# Patient Record
Sex: Male | Born: 1965
Health system: Southern US, Community
[De-identification: ages and names within clinical notes are randomized; demographics above are authoritative.]

## PROBLEM LIST (undated history)

## (undated) DIAGNOSIS — I10 Essential (primary) hypertension: Secondary | ICD-10-CM

## (undated) DIAGNOSIS — M1990 Unspecified osteoarthritis, unspecified site: Secondary | ICD-10-CM

## (undated) DIAGNOSIS — M459 Ankylosing spondylitis of unspecified sites in spine: Secondary | ICD-10-CM

## (undated) HISTORY — PX: ANTERIOR CRUCIATE LIGAMENT REPAIR: SHX115

## (undated) HISTORY — PX: ROTATOR CUFF REPAIR: SHX139

---

## 1996-01-30 DIAGNOSIS — M459 Ankylosing spondylitis of unspecified sites in spine: Secondary | ICD-10-CM | POA: Insufficient documentation

## 1998-06-01 ENCOUNTER — Ambulatory Visit (HOSPITAL_COMMUNITY): Admission: RE | Admit: 1998-06-01 | Discharge: 1998-06-01 | Payer: Self-pay | Admitting: Family Medicine

## 1998-06-01 ENCOUNTER — Encounter: Payer: Self-pay | Admitting: Family Medicine

## 1998-12-20 ENCOUNTER — Ambulatory Visit (HOSPITAL_COMMUNITY): Admission: RE | Admit: 1998-12-20 | Discharge: 1998-12-20 | Payer: Self-pay | Admitting: Family Medicine

## 1998-12-20 ENCOUNTER — Encounter: Payer: Self-pay | Admitting: Family Medicine

## 1999-11-13 ENCOUNTER — Ambulatory Visit (HOSPITAL_COMMUNITY): Admission: RE | Admit: 1999-11-13 | Discharge: 1999-11-13 | Payer: Self-pay | Admitting: Family Medicine

## 1999-11-13 ENCOUNTER — Encounter: Payer: Self-pay | Admitting: Family Medicine

## 1999-11-30 ENCOUNTER — Encounter: Admission: RE | Admit: 1999-11-30 | Discharge: 2000-01-04 | Payer: Self-pay | Admitting: Family Medicine

## 2000-02-27 ENCOUNTER — Encounter: Admission: RE | Admit: 2000-02-27 | Discharge: 2000-02-27 | Payer: Self-pay | Admitting: Family Medicine

## 2000-02-27 ENCOUNTER — Encounter: Payer: Self-pay | Admitting: Family Medicine

## 2000-07-01 ENCOUNTER — Encounter: Admission: RE | Admit: 2000-07-01 | Discharge: 2000-07-01 | Payer: Self-pay | Admitting: Family Medicine

## 2000-07-01 ENCOUNTER — Encounter: Payer: Self-pay | Admitting: Family Medicine

## 2000-07-04 ENCOUNTER — Encounter: Payer: Self-pay | Admitting: Orthopedic Surgery

## 2000-07-04 ENCOUNTER — Encounter: Admission: RE | Admit: 2000-07-04 | Discharge: 2000-07-04 | Payer: Self-pay | Admitting: Orthopedic Surgery

## 2000-07-23 ENCOUNTER — Encounter: Payer: Self-pay | Admitting: Orthopedic Surgery

## 2000-07-23 ENCOUNTER — Ambulatory Visit (HOSPITAL_COMMUNITY): Admission: RE | Admit: 2000-07-23 | Discharge: 2000-07-23 | Payer: Self-pay | Admitting: Orthopedic Surgery

## 2000-08-15 ENCOUNTER — Encounter: Admission: RE | Admit: 2000-08-15 | Discharge: 2000-08-30 | Payer: Self-pay | Admitting: Orthopedic Surgery

## 2002-12-15 ENCOUNTER — Emergency Department (HOSPITAL_COMMUNITY): Admission: EM | Admit: 2002-12-15 | Discharge: 2002-12-15 | Payer: Self-pay | Admitting: Emergency Medicine

## 2003-03-22 ENCOUNTER — Ambulatory Visit (HOSPITAL_COMMUNITY): Admission: RE | Admit: 2003-03-22 | Discharge: 2003-03-22 | Payer: Self-pay | Admitting: Internal Medicine

## 2003-04-11 ENCOUNTER — Emergency Department (HOSPITAL_COMMUNITY): Admission: EM | Admit: 2003-04-11 | Discharge: 2003-04-11 | Payer: Self-pay | Admitting: Emergency Medicine

## 2003-10-15 ENCOUNTER — Ambulatory Visit: Payer: Self-pay | Admitting: *Deleted

## 2004-02-18 ENCOUNTER — Emergency Department (HOSPITAL_COMMUNITY): Admission: EM | Admit: 2004-02-18 | Discharge: 2004-02-18 | Payer: Self-pay | Admitting: Family Medicine

## 2005-03-02 ENCOUNTER — Encounter: Admission: RE | Admit: 2005-03-02 | Discharge: 2005-05-31 | Payer: Self-pay | Admitting: Occupational Medicine

## 2006-02-14 ENCOUNTER — Emergency Department (HOSPITAL_COMMUNITY): Admission: EM | Admit: 2006-02-14 | Discharge: 2006-02-14 | Payer: Self-pay | Admitting: Emergency Medicine

## 2011-08-24 ENCOUNTER — Encounter (HOSPITAL_COMMUNITY): Payer: Self-pay | Admitting: *Deleted

## 2011-08-24 ENCOUNTER — Emergency Department (HOSPITAL_COMMUNITY)
Admission: EM | Admit: 2011-08-24 | Discharge: 2011-08-24 | Disposition: A | Discharge status: Home / Self Care | Payer: PRIVATE HEALTH INSURANCE | Attending: Emergency Medicine | Admitting: Emergency Medicine

## 2011-08-24 DIAGNOSIS — H209 Unspecified iridocyclitis: Secondary | ICD-10-CM | POA: Insufficient documentation

## 2011-08-24 DIAGNOSIS — M129 Arthropathy, unspecified: Secondary | ICD-10-CM | POA: Insufficient documentation

## 2011-08-24 HISTORY — DX: Unspecified osteoarthritis, unspecified site: M19.90

## 2011-08-24 HISTORY — DX: Ankylosing spondylitis of unspecified sites in spine: M45.9

## 2011-08-24 MED ORDER — ATROPINE SULFATE 1 % OP SOLN
1.0000 [drp] | Freq: Once | OPHTHALMIC | Status: AC
  Administered 2011-08-24: 1 [drp] via OPHTHALMIC
  Filled 2011-08-24: qty 2

## 2011-08-24 MED ORDER — ATROPINE SULFATE 1 % OP SOLN: 1.0000 [drp] | Freq: Four times a day (QID) | OPHTHALMIC | Status: DC

## 2011-08-24 MED ORDER — PREDNISOLONE ACETATE 1 % OP SUSP: 2.0000 [drp] | Freq: Four times a day (QID) | OPHTHALMIC | Status: AC

## 2011-08-24 MED ORDER — PREDNISOLONE ACETATE 1 % OP SUSP
2.0000 [drp] | Freq: Once | OPHTHALMIC | Status: AC
  Administered 2011-08-24: 2 [drp] via OPHTHALMIC
  Filled 2011-08-24: qty 1

## 2011-08-24 NOTE — ED Provider Notes (Signed)
Medical screening examination/treatment/procedure(s) were performed by non-physician practitioner and as supervising physician I was immediately available for consultation/collaboration.  Erinne Gillentine R. Gerrett Loman, MD 08/24/11 0724 

## 2011-08-24 NOTE — ED Notes (Addendum)
[facility].  He was tx with tobramycin but the pain, photophobia and redness is increasing.  He has a hx of iritis d/t his hx of ankylosing spondylitis

## 2011-08-24 NOTE — ED Provider Notes (Signed)
History     CSN: [id number]  Arrival date & time 08/24/11  0159   First MD Initiated Contact with Patient 08/24/11 0224      Chief Complaint  Patient presents with  . Eye Pain    (Consider location/radiation/quality/duration/timing/severity/associated sxs/prior treatment) HPI Comments: Pateint with Hx Ankylosing Spondylitis and Iritis started havign L eye pain and redness 4 days ago [facility] and given antibiotic drops without relief + photophobia, redness without drainage/discharge  Patient is a 46 y.o. male presenting with eye pain. The history is provided by the patient.  Eye Pain This is a recurrent problem. The current episode started in the past 7 days. The problem has been gradually worsening. Pertinent negatives include no fever, headaches, neck pain or rash. The treatment provided moderate relief.    Past Medical History  Diagnosis Date  . Ankylosing spondylitis   . Arthritis     Past Surgical History  Procedure Date  . Anterior cruciate ligament repair   . Rotator cuff repair     No family history on file.  History  Substance Use Topics  . Smoking status: Never Smoker   . Smokeless tobacco: Not on file  . Alcohol Use: Yes     rarely      Review of Systems  Constitutional: Negative for fever.  HENT: Negative for neck pain.   Eyes: Positive for photophobia, pain and redness. Negative for discharge and itching.  Skin: Negative for rash.  Neurological: Negative for dizziness and headaches.    Allergies  Review of patient's allergies indicates no known allergies.  Home Medications   Current Outpatient Rx  Name Route Sig Dispense Refill  . NAPROXEN SODIUM 220 MG PO TABS Oral Take 220 mg by mouth daily as needed. For pain    . ATROPINE SULFATE 1 % OP SOLN Left Eye Place 1 drop into the left eye 4 (four) times daily. 2 mL 0  . PREDNISOLONE ACETATE 1 % OP SUSP Left Eye Place 2 drops into the left eye 4 (four) times daily. 5 mL 0    BP  136/102  Pulse 90  Temp 98.3 F (36.8 C) (Oral)  Resp 16  SpO2 99%  Physical Exam  Constitutional: He appears well-developed and well-nourished.  HENT:  Head: Normocephalic.  Eyes: Left eye exhibits no discharge and no exudate. No foreign body present in the left eye. Left conjunctiva is injected. Left conjunctiva has no hemorrhage. Left eye exhibits normal extraocular motion and no nystagmus. Left pupil is round and reactive.  Neck: Normal range of motion.  Cardiovascular: Normal rate.   Pulmonary/Chest: Effort normal.  Abdominal: Soft.  Genitourinary: Penis normal.  Neurological: He is alert.  Skin: Skin is warm. No rash noted.    ED Course  Procedures (including critical care time)  Labs Reviewed - No data to display No results found.   1. Iritis       MDM  Will treat with atropine optho gtts and Prednisone optho gtts And have patient FU with opthamology Patient received immediate relief.  Once the midriatic drop was placed       Arman Filter, NP 08/24/11 0258  Arman Filter, NP 08/24/11 0258  Arman Filter, NP 08/24/11 [phone]

## 2011-10-06 ENCOUNTER — Emergency Department (HOSPITAL_COMMUNITY)
Admission: EM | Admit: 2011-10-06 | Discharge: 2011-10-06 | Disposition: A | Discharge status: Home / Self Care | Payer: Self-pay | Source: Home / Self Care | Attending: Emergency Medicine | Admitting: Emergency Medicine

## 2011-10-06 ENCOUNTER — Encounter (HOSPITAL_COMMUNITY): Payer: Self-pay | Admitting: Emergency Medicine

## 2011-10-06 DIAGNOSIS — M459 Ankylosing spondylitis of unspecified sites in spine: Secondary | ICD-10-CM

## 2011-10-06 MED ORDER — HYDROCODONE-ACETAMINOPHEN 5-325 MG PO TABS: ORAL_TABLET | ORAL | Status: AC

## 2011-10-06 MED ORDER — INDOMETHACIN ER 75 MG PO CPCR: 75.0000 mg | ORAL_CAPSULE | Freq: Two times a day (BID) | ORAL | Status: DC

## 2011-10-06 NOTE — ED Notes (Signed)
Neck and back pain for 3-4 months ago, denies injury.

## 2011-10-06 NOTE — ED Provider Notes (Signed)
Chief Complaint  Patient presents with  . 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. 30 years ago he was diagnosed with ankylosing spondylitis. This was first diagnosed by Dr. Myrtie Neither and he was referred to Dr. Durenda Age. He's been treated with indomethacin, Vioxx, Vicodin, and Ultram. Recently he's been using some topicals and naproxen without a lot of improvement. He's had polyarthralgias and stiffness and pain in his neck and back. He has had pain in his right shoulder which has been diagnosed with rotator cuff tear and also swelling of his knees related to the anterior cruciate ligament and meniscus injuries. He notes decreased range of motion of his back and neck. His symptoms have been worse the past 3-4 months. He doesn't have anything to take right now except for over-the-counter medications. He plans to see a rheumatologist sometime in the near future.  Review of Systems:  Other than noted above, the patient denies any of the following symptoms: Systemic:  No fevers, chills, sweats, or aches.  No fatigue or tiredness. Musculoskeletal:  No joint pain, arthritis, bursitis, swelling, back pain, or neck pain. Neurological:  No muscular weakness, paresthesias, headache, or trouble with speech or coordination.  No dizziness.   PMFSH:  Past medical history, family history, social history, meds, and allergies were reviewed.  Physical Exam:   Vital signs:  BP 139/95  Pulse 64  Temp 97.9 F (36.6 C) (Oral)  Resp 14  SpO2 100% Gen:  Alert and oriented times 3.  In no distress. Musculoskeletal: His back and his neck have a surprisingly good range of motion in his Shober's index is normal. Otherwise, all joints had a full a ROM with no swelling, bruising or deformity.  No edema, pulses full. Extremities were warm and pink.  Capillary refill was brisk.  Skin:  Clear, warm and dry.  No rash. Neuro:  Alert and oriented times 3.   Muscle strength was normal.  Sensation was intact to light touch.   Assessment:  The encounter diagnosis was Ankylosing spondylitis.  Plan:   1.  The following meds were prescribed:   New Prescriptions   HYDROCODONE-ACETAMINOPHEN (NORCO/VICODIN) 5-325 MG PER TABLET    1 to 2 tabs every 4 to 6 hours as needed for pain.   INDOMETHACIN (INDOCIN SR) 75 MG CR CAPSULE    Take 1 capsule (75 mg total) by mouth 2 (two) times daily with a meal.   2.  The patient was instructed in symptomatic care, including rest and activity, elevation, application of ice and compression.  Appropriate handouts were given. 3.  The patient was told to return if becoming worse in any way, if no better in 3 or 4 days, and given some red flag symptoms that would indicate earlier return.   4.  The patient was told to follow up with Dr. Stacey Drain or Dr. Durenda Age.   Reuben Likes, MD 10/06/11 2142

## 2011-11-12 ENCOUNTER — Ambulatory Visit
Admission: RE | Admit: 2011-11-12 | Discharge: 2011-11-12 | Disposition: A | Discharge status: Home / Self Care | Payer: 59 | Source: Ambulatory Visit | Attending: Rheumatology | Admitting: Rheumatology

## 2011-11-12 ENCOUNTER — Other Ambulatory Visit: Payer: Self-pay | Admitting: Rheumatology

## 2011-11-12 DIAGNOSIS — M549 Dorsalgia, unspecified: Secondary | ICD-10-CM

## 2011-12-13 ENCOUNTER — Encounter: Payer: Self-pay | Admitting: Pharmacist

## 2011-12-13 ENCOUNTER — Ambulatory Visit (INDEPENDENT_AMBULATORY_CARE_PROVIDER_SITE_OTHER): Payer: Self-pay | Admitting: Pharmacist

## 2011-12-13 VITALS — BP 147/85 | HR 79 | Ht 74.5 in | Wt 180.2 lb

## 2011-12-13 DIAGNOSIS — M459 Ankylosing spondylitis of unspecified sites in spine: Secondary | ICD-10-CM

## 2011-12-13 DIAGNOSIS — H209 Unspecified iridocyclitis: Secondary | ICD-10-CM

## 2011-12-13 NOTE — Patient Instructions (Addendum)
Thanks for coming in today!

## 2011-12-13 NOTE — Assessment & Plan Note (Signed)
Following medication review, no suggestions for change.  Complete medication list provided to patient.  Total time in face to face medication review: 15 minutes.  Patient seen with: Bola Lawuyi PharmD, Pharmacy Resident. 

## 2011-12-13 NOTE — Progress Notes (Signed)
  Subjective:    Patient ID: Cameron Brown, male    DOB: 09-29-1965, 46 y.o.   MRN: [id number]  HPI Patient arrives in good spirits for medication review.   Reports seeing looking for primary care provider, Dr. Kellie Simmering as rheumatologist. Reports being diagnosed with ankylosing spondylitis since age 76 and Has been recently been started on Humira   Review of Systems     Objective:   Physical Exam        Assessment & Plan:  Following medication review, no suggestions for change.  Complete medication list provided to patient.  Total time in face to face medication review: 15 minutes.  Patient seen with: Maryanna Shape PharmD, Pharmacy Resident.

## 2011-12-18 NOTE — Progress Notes (Signed)
Patient ID: Cameron Brown, male   DOB: 23-Aug-1965, 46 y.o.   MRN: [id number] Reviewed and agree with Dr. Macky Lower documentation and management.

## 2012-12-04 ENCOUNTER — Emergency Department (HOSPITAL_COMMUNITY)
Admission: EM | Admit: 2012-12-04 | Discharge: 2012-12-04 | Disposition: A | Discharge status: Home / Self Care | Payer: 59 | Source: Home / Self Care | Attending: Family Medicine | Admitting: Family Medicine

## 2012-12-04 ENCOUNTER — Encounter (HOSPITAL_COMMUNITY): Payer: Self-pay | Admitting: Emergency Medicine

## 2012-12-04 DIAGNOSIS — N529 Male erectile dysfunction, unspecified: Secondary | ICD-10-CM | POA: Diagnosis present

## 2012-12-04 MED ORDER — TADALAFIL 5 MG PO TABS: 5.0000 mg | ORAL_TABLET | Freq: Every day | ORAL | Status: AC

## 2012-12-04 NOTE — ED Notes (Signed)
Refill on medication of cialis

## 2012-12-04 NOTE — ED Provider Notes (Signed)
Cameron Brown is a 47 y.o. male who presents to Urgent Care today for erectile dysfunction. Patient has a history of erectile dysfunction over the past several years. He uses Cialis daily to treat this problem. It works very well. He has run out of his prescription and has not yet had a primary care doctor. He feels well without any other issues. No fevers chills nausea vomiting or diarrhea   Past Medical History  Diagnosis Date  . Ankylosing spondylitis   . Arthritis    History  Substance Use Topics  . Smoking status: Former Smoker -- 0.50 packs/day for 10 years    Types: Cigarettes    Start date: 01/30/1991    Quit date: 01/29/2001  . Smokeless tobacco: Never Used     Comment: Cigars prn boredom  . Alcohol Use: Yes     Comment: rarely   ROS as above Medications reviewed. No current facility-administered medications for this encounter.   Current Outpatient Prescriptions  Medication Sig Dispense Refill  . adalimumab (HUMIRA) 40 MG/0.8ML injection Inject 0.8 mLs (40 mg total) into the skin every 14 (fourteen) days.      Marland Kitchen atropine 1 % ophthalmic solution Place 1 drop into the left eye 4 (four) times daily.  2 mL  0  . Glucosamine-Chondroit-Vit C-Mn (GLUCOSAMINE CHONDR 1500 COMPLX PO) Take 1 tablet by mouth daily.      . naproxen sodium (ANAPROX) 220 MG tablet Take 220 mg by mouth daily as needed. For pain      . tadalafil (CIALIS) 5 MG tablet Take 1 tablet (5 mg total) by mouth daily.  30 tablet  1    Exam:  BP 135/83  Pulse 73  Temp(Src) 98.2 F (36.8 C) (Oral)  Resp 16  SpO2 100% Gen: Well NAD Lungs: CTABL Nl WOB Heart: RRR no MRG Abd: NABS, NT, ND  No results found for this or any previous visit (from the past 24 hour(s)). No results found.  Assessment and Plan: 47 y.o. male with erectile dysfunction.  Plan the refill Cialis and advised patient to followup with primary care provider.  Discussed warning signs or symptoms. Please see discharge instructions.  Patient expresses understanding.      Rodolph Bong, MD 12/04/12 1051

## 2014-01-30 IMAGING — CR DG LUMBAR SPINE COMPLETE 4+V
5 series · 5 of 5 positions shown · non-contrast
Comparison: None

CLINICAL DATA: Low back pain.

LUMBAR SPINE - COMPLETE 4+ VIEW

[t l-spine a.p.]
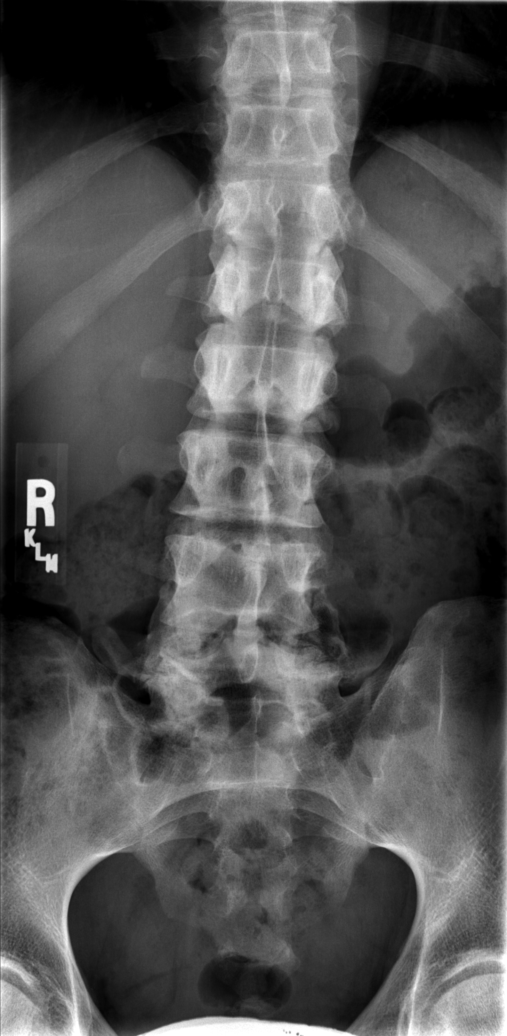

[t l-spine oblique exposure (1 of 2)]
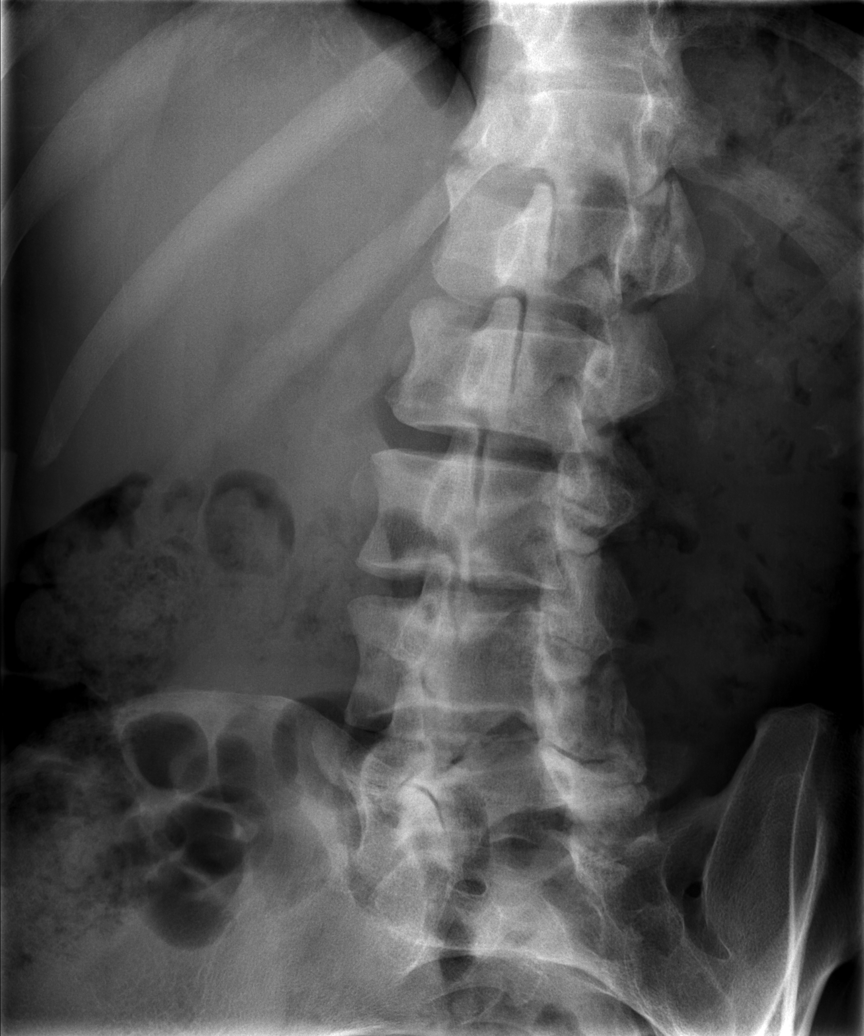

[t l-spine oblique exposure (2 of 2)]
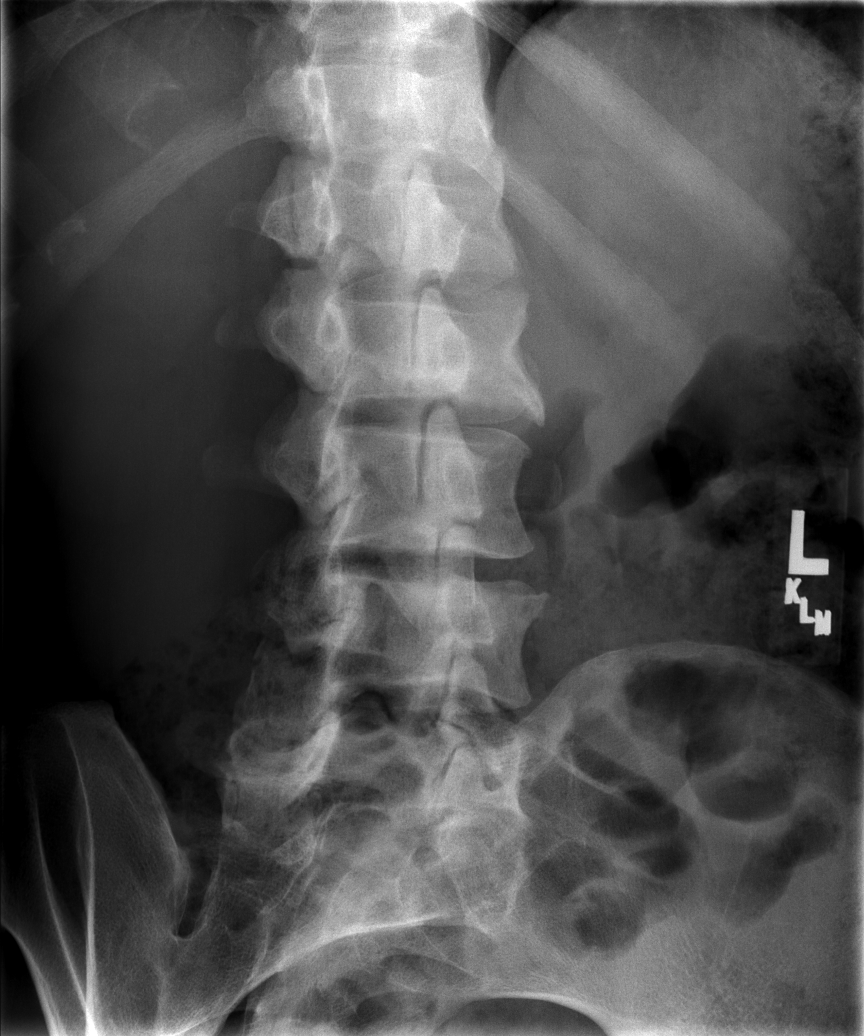

[t l-spine lat]
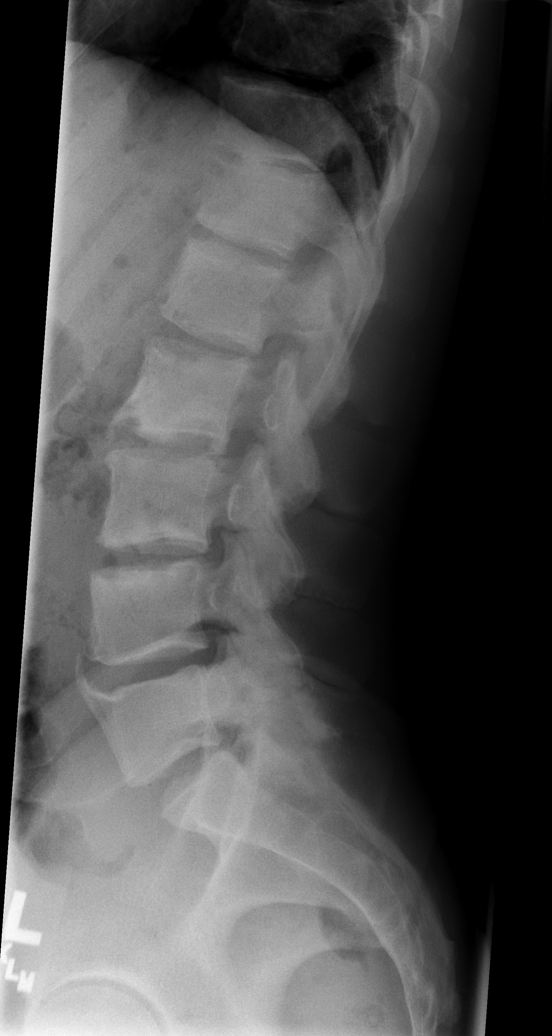

[t l-spine l5-s1 spot]
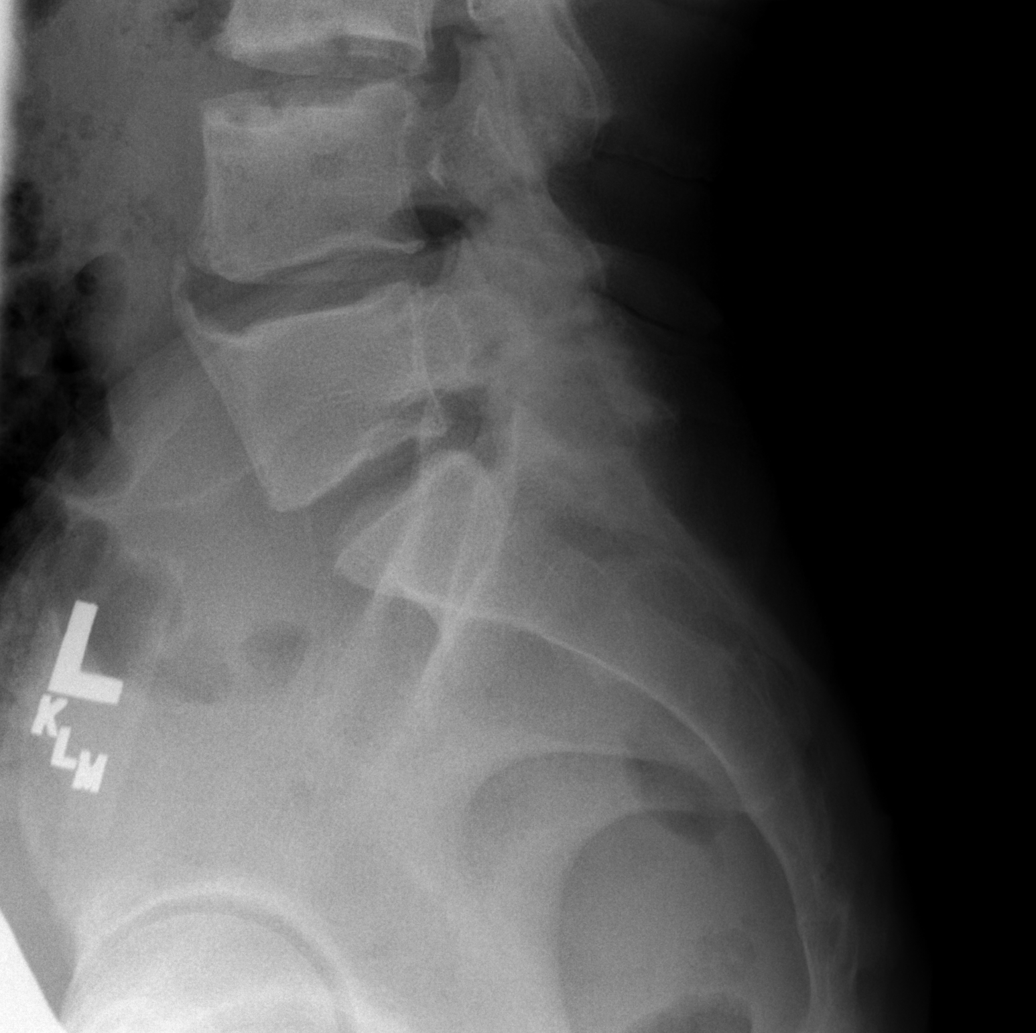

[5 of 5 positions shown; findings below may reference images not displayed]

FINDINGS: Degenerative disc and facet disease throughout the lumbar
spine.  Advanced degenerative facet disease at L4-5 and L5-S1 with
bony overgrowth.  Normal alignment.  No fracture.  There appears to
be fusion across the SI joints bilaterally.
IMPRESSION: Degenerative changes in the lower lumbar spine, particularly facet
arthropathy.  Apparent fusion across the SI joints bilaterally.

## 2015-02-03 DIAGNOSIS — Z79899 Other long term (current) drug therapy: Secondary | ICD-10-CM | POA: Diagnosis not present

## 2015-02-03 DIAGNOSIS — M459 Ankylosing spondylitis of unspecified sites in spine: Secondary | ICD-10-CM | POA: Diagnosis not present

## 2015-02-03 DIAGNOSIS — M25561 Pain in right knee: Secondary | ICD-10-CM | POA: Diagnosis not present

## 2015-02-03 MED FILL — HUMIRA PEN 40 MG/0.8ML PNKT: 40 | 28 days supply | Qty: 2 | Fill #0

## 2015-02-23 MED FILL — CIALIS 5 MG TABLET: 5 | 30 days supply | Qty: 30 | Fill #6

## 2015-02-24 MED FILL — HUMIRA PEN 40 MG/0.8ML PNKT: 40 | 28 days supply | Qty: 2 | Fill #1

## 2015-03-15 DIAGNOSIS — R030 Elevated blood-pressure reading, without diagnosis of hypertension: Secondary | ICD-10-CM | POA: Diagnosis not present

## 2015-03-15 DIAGNOSIS — M450 Ankylosing spondylitis of multiple sites in spine: Secondary | ICD-10-CM | POA: Diagnosis not present

## 2015-03-15 DIAGNOSIS — F5221 Male erectile disorder: Secondary | ICD-10-CM | POA: Diagnosis not present

## 2015-03-30 MED FILL — HUMIRA PEN 40 MG/0.8ML PNKT: 40 | 28 days supply | Qty: 2 | Fill #2

## 2015-03-31 MED FILL — CIALIS 5 MG TABLET: 5 | 30 days supply | Qty: 30 | Fill #0

## 2015-04-26 MED FILL — CIALIS 5 MG TABLET: 5 | 30 days supply | Qty: 30 | Fill #1

## 2015-04-26 MED FILL — HUMIRA PEN 40 MG/0.8ML PNKT: 40 | 28 days supply | Qty: 2 | Fill #3

## 2015-05-26 MED FILL — CIALIS 5 MG TABLET: 5 | 30 days supply | Qty: 30 | Fill #2

## 2015-05-26 MED FILL — HUMIRA PEN 40 MG/0.8ML PNKT: 40 | 28 days supply | Qty: 2 | Fill #4

## 2015-06-20 MED FILL — CIALIS 5 MG TABLET: 5 | 30 days supply | Qty: 30 | Fill #3

## 2015-06-20 MED FILL — HUMIRA PEN 40 MG/0.8ML PNKT: 40 | 28 days supply | Qty: 2 | Fill #5

## 2015-07-29 MED FILL — CIALIS 5 MG TABLET: 5 | 30 days supply | Qty: 30 | Fill #4

## 2015-08-04 DIAGNOSIS — H209 Unspecified iridocyclitis: Secondary | ICD-10-CM | POA: Diagnosis not present

## 2015-08-04 DIAGNOSIS — M459 Ankylosing spondylitis of unspecified sites in spine: Secondary | ICD-10-CM | POA: Diagnosis not present

## 2015-08-04 DIAGNOSIS — M545 Low back pain: Secondary | ICD-10-CM | POA: Diagnosis not present

## 2015-08-04 DIAGNOSIS — Z79899 Other long term (current) drug therapy: Secondary | ICD-10-CM | POA: Diagnosis not present

## 2015-08-04 MED FILL — HUMIRA PEN 40 MG/0.8ML PNKT: 40 | 28 days supply | Qty: 2 | Fill #0

## 2015-08-22 ENCOUNTER — Telehealth: Payer: Self-pay | Admitting: Pharmacist

## 2015-08-22 NOTE — Telephone Encounter (Signed)
Called patient to schedule an appointment for the [facility]. I was unable to reach the patient so I left a HIPAA-compliant message requesting that the patient return my call.   

## 2015-08-24 MED FILL — CIALIS 5 MG TABLET: 5 | 30 days supply | Qty: 30 | Fill #5

## 2015-08-30 ENCOUNTER — Ambulatory Visit (HOSPITAL_BASED_OUTPATIENT_CLINIC_OR_DEPARTMENT_OTHER): Payer: Self-pay | Admitting: Pharmacist

## 2015-08-30 DIAGNOSIS — M459 Ankylosing spondylitis of unspecified sites in spine: Secondary | ICD-10-CM

## 2015-08-30 MED ORDER — ADALIMUMAB 40 MG/0.8ML ~~LOC~~ AJKT: 40.0000 mg | AUTO-INJECTOR | SUBCUTANEOUS | 4 refills | Status: DC

## 2015-08-30 NOTE — Progress Notes (Signed)
   S: Patient presents today to the [facility].  Patient is currently taking Humira for ankylosing spondylitis. Patient is managed by Dr. Kellie Simmering for this.   Adherence: denies any missed doses. Rotates injection sites. Feels like it is working well to control his disease state.  Dosing:  Ankylosing spondylitis: SubQ: 40 mg every other week (may continue methotrexate, other nonbiologic DMARDS, corticosteroids, NSAIDs and/or analgesics)  Drug-drug interactions: none  Screening: TB test: completed per patient Hepatitis: completed per patient  Monitoring: S/sx of infection: denies CBC done by Dr. Kellie Simmering and Dr. Parke Simmers, due for one next week, reports they have always been normal. S/sx of hypersensitivity: denies S/sx of malignancy: denies S/sx of heart failure: denies    O:     No results found for: WBC, HGB, HCT, MCV, PLT    Chemistry   No results found for: NA, K, CL, CO2, BUN, CREATININE, GLU No results found for: CALCIUM, ALKPHOS, AST, ALT, BILITOT     A/P: 1. Medication review: Patient on Humira for ankylosing spondylitis and is tolerating it well with no adverse effects and improved control of disease state. Reviewed the medication with him, including the following: Humira is a TNF blocking agent indicated for ankylosing spondylitis, Crohn's disease, Hidradenitis suppurativa, psoriatic arthritis, plaque psoriasis, ulcerative colitis, and uveitis. The most common adverse effects are infections, headache, and injection site reactions. There is the possibility of an increased risk of malignancy but it is not well understood if this increased risk is due to there medication or the disease state. There are rare cases of pancytopenia and aplastic anemia. No recommendations for changes.     Juanita Craver, PharmD, BCPS, BCACP, CPP Clinical Pharmacist Practitioner  [facility] [phone]

## 2015-09-08 MED FILL — HUMIRA PEN 40 MG/0.8ML PNKT: 40 | 28 days supply | Qty: 2 | Fill #0

## 2015-09-13 DIAGNOSIS — F4329 Adjustment disorder with other symptoms: Secondary | ICD-10-CM | POA: Diagnosis not present

## 2015-09-13 DIAGNOSIS — F5221 Male erectile disorder: Secondary | ICD-10-CM | POA: Diagnosis not present

## 2015-09-13 DIAGNOSIS — M450 Ankylosing spondylitis of multiple sites in spine: Secondary | ICD-10-CM | POA: Diagnosis not present

## 2015-09-13 DIAGNOSIS — F4324 Adjustment disorder with disturbance of conduct: Secondary | ICD-10-CM | POA: Diagnosis not present

## 2015-09-13 DIAGNOSIS — R030 Elevated blood-pressure reading, without diagnosis of hypertension: Secondary | ICD-10-CM | POA: Diagnosis not present

## 2015-09-26 MED FILL — VIT D2 1.25 MG (50,000 UNIT: 1.25 MG | 28 days supply | Qty: 4 | Fill #0

## 2015-09-28 MED FILL — CIALIS 5 MG TABLET: 5 | 30 days supply | Qty: 30 | Fill #6

## 2015-10-07 MED FILL — HUMIRA PEN 40 MG/0.8ML PNKT: 40 | 28 days supply | Qty: 2 | Fill #1

## 2015-10-31 MED FILL — CIALIS 5 MG TABLET: 5 | 30 days supply | Qty: 30 | Fill #0

## 2015-10-31 MED FILL — HUMIRA PEN 40 MG/0.8ML PNKT: 40 | 28 days supply | Qty: 2 | Fill #2

## 2015-10-31 MED FILL — VIT D2 1.25 MG (50,000 UNIT: 1.25 MG | 28 days supply | Qty: 4 | Fill #1

## 2015-12-08 MED FILL — HUMIRA PEN 40 MG/0.8ML PNKT: 40 | 28 days supply | Qty: 2 | Fill #3

## 2015-12-08 MED FILL — VIT D2 1.25 MG (50,000 UNIT: 1.25 MG | 28 days supply | Qty: 4 | Fill #2

## 2015-12-08 MED FILL — CIALIS 5 MG TABLET: 5 | 30 days supply | Qty: 30 | Fill #1

## 2016-01-02 MED FILL — HUMIRA PEN 40 MG/0.8ML PNKT: 40 | 28 days supply | Qty: 2 | Fill #4

## 2016-01-02 MED FILL — CIALIS 5 MG TABLET: 5 | 30 days supply | Qty: 30 | Fill #2

## 2016-01-02 MED FILL — VIT D2 1.25 MG (50,000 UNIT: 1.25 MG | 28 days supply | Qty: 4 | Fill #3

## 2016-01-25 MED FILL — CIALIS 5 MG TABLET: 5 | 30 days supply | Qty: 30 | Fill #3

## 2016-02-07 ENCOUNTER — Other Ambulatory Visit: Payer: Self-pay | Admitting: Pharmacist

## 2016-02-07 DIAGNOSIS — Z79899 Other long term (current) drug therapy: Secondary | ICD-10-CM | POA: Diagnosis not present

## 2016-02-07 DIAGNOSIS — M459 Ankylosing spondylitis of unspecified sites in spine: Secondary | ICD-10-CM | POA: Diagnosis not present

## 2016-02-07 MED ORDER — ADALIMUMAB 40 MG/0.8ML ~~LOC~~ AJKT: 1.0000 "pen " | AUTO-INJECTOR | SUBCUTANEOUS | 5 refills | Status: DC

## 2016-02-07 MED FILL — HUMIRA PEN 40 MG/0.8ML PNKT: 40 | 28 days supply | Qty: 2 | Fill #0

## 2016-02-21 MED FILL — CIALIS 5 MG TABLET: 5 | 30 days supply | Qty: 30 | Fill #4

## 2016-03-06 DIAGNOSIS — M130 Polyarthritis, unspecified: Secondary | ICD-10-CM | POA: Diagnosis not present

## 2016-03-06 DIAGNOSIS — I10 Essential (primary) hypertension: Secondary | ICD-10-CM | POA: Diagnosis not present

## 2016-03-06 MED FILL — TELMISARTAN-HCTZ 40-12.5 MG: 40-12.5 | 30 days supply | Qty: 30 | Fill #0

## 2016-03-08 MED FILL — HUMIRA PEN 40 MG/0.8ML PNKT: 40 | 28 days supply | Qty: 2 | Fill #1

## 2016-03-26 MED FILL — VIT D2 1.25 MG (50,000 UNIT: 1.25 MG | 28 days supply | Qty: 4 | Fill #4

## 2016-03-26 MED FILL — CIALIS 5 MG TABLET: 5 | 30 days supply | Qty: 30 | Fill #5

## 2016-03-28 DIAGNOSIS — M170 Bilateral primary osteoarthritis of knee: Secondary | ICD-10-CM | POA: Diagnosis not present

## 2016-03-28 DIAGNOSIS — M1711 Unilateral primary osteoarthritis, right knee: Secondary | ICD-10-CM | POA: Diagnosis not present

## 2016-04-25 MED FILL — CIALIS 5 MG TABLET: 5 | 30 days supply | Qty: 30 | Fill #6

## 2016-04-25 MED FILL — TELMISARTAN-HCTZ 40-12.5 MG: 40-12.5 | 30 days supply | Qty: 30 | Fill #1

## 2016-04-25 MED FILL — HUMIRA PEN 40 MG/0.8ML PNKT: 40 | 28 days supply | Qty: 2 | Fill #2

## 2016-04-26 DIAGNOSIS — M170 Bilateral primary osteoarthritis of knee: Secondary | ICD-10-CM | POA: Diagnosis not present

## 2016-05-11 DIAGNOSIS — M450 Ankylosing spondylitis of multiple sites in spine: Secondary | ICD-10-CM | POA: Diagnosis not present

## 2016-05-11 DIAGNOSIS — M130 Polyarthritis, unspecified: Secondary | ICD-10-CM | POA: Diagnosis not present

## 2016-05-11 DIAGNOSIS — I10 Essential (primary) hypertension: Secondary | ICD-10-CM | POA: Diagnosis not present

## 2016-05-11 DIAGNOSIS — F5221 Male erectile disorder: Secondary | ICD-10-CM | POA: Diagnosis not present

## 2016-05-11 DIAGNOSIS — E559 Vitamin D deficiency, unspecified: Secondary | ICD-10-CM | POA: Diagnosis not present

## 2016-05-28 MED FILL — CIALIS 5 MG TABLET: 5 | 30 days supply | Qty: 30 | Fill #0

## 2016-05-28 MED FILL — HUMIRA PEN 40 MG/0.8ML PNKT: 40 | 28 days supply | Qty: 2 | Fill #3

## 2016-06-28 MED FILL — CIALIS 5 MG TABLET: 5 | 30 days supply | Qty: 30 | Fill #1

## 2016-06-28 MED FILL — TELMISARTAN-HCTZ 40-12.5 MG: 40-12.5 | 30 days supply | Qty: 30 | Fill #2

## 2016-06-28 MED FILL — HUMIRA PEN 40 MG/0.8ML PNKT: 40 | 28 days supply | Qty: 2 | Fill #4

## 2016-07-25 MED FILL — CIALIS 5 MG TABLET: 5 | 30 days supply | Qty: 30 | Fill #2

## 2016-08-14 MED FILL — HUMIRA PEN 40 MG/0.8ML PNKT: 40 | 28 days supply | Qty: 2 | Fill #5

## 2016-09-05 MED FILL — HUMIRA PEN 40 MG/0.8ML PNKT: 40 | 28 days supply | Qty: 2 | Fill #6

## 2016-09-05 MED FILL — CIALIS 5 MG TABLET: 5 | 30 days supply | Qty: 30 | Fill #3

## 2016-09-27 MED FILL — HUMIRA PEN 40 MG/0.8ML PNKT: 40 | 28 days supply | Qty: 2 | Fill #7

## 2016-09-29 MED FILL — CIALIS 5 MG TABLET: 5 | 30 days supply | Qty: 30 | Fill #4

## 2016-10-15 ENCOUNTER — Other Ambulatory Visit: Payer: Self-pay | Admitting: Pharmacist

## 2016-10-15 DIAGNOSIS — Z6823 Body mass index (BMI) 23.0-23.9, adult: Secondary | ICD-10-CM | POA: Diagnosis not present

## 2016-10-15 DIAGNOSIS — M450 Ankylosing spondylitis of multiple sites in spine: Secondary | ICD-10-CM | POA: Diagnosis not present

## 2016-10-15 MED ORDER — ADALIMUMAB 40 MG/0.8ML ~~LOC~~ AJKT: 1.0000 "pen " | AUTO-INJECTOR | SUBCUTANEOUS | 5 refills | Status: DC

## 2016-10-19 ENCOUNTER — Ambulatory Visit: Payer: 59 | Attending: Pharmacist | Admitting: Pharmacist

## 2016-10-19 DIAGNOSIS — Z79899 Other long term (current) drug therapy: Secondary | ICD-10-CM

## 2016-10-19 NOTE — Progress Notes (Signed)
   S: Patient presents today to the [facility] for review of his specialty medication.  Patient is currently taking Humira for ankylosing spondylitis. Patient is managed by Dr. Dierdre Forth for this.  Adherence: reports 1 missed dose over the last year. Rotates injection sites. Feels like it is working well to control his disease state.  Dosing:  Ankylosing spondylitis: SubQ: 40 mg every other week (may continue methotrexate, other nonbiologic DMARDS, corticosteroids, NSAIDs and/or analgesics)  Drug-drug interactions: none  Screening: TB test: completed per patient Hepatitis: completed per patient  Monitoring: S/sx of infection: denies CBC done by Dr. Dierdre Forth and Dr. Parke Simmers, due for one next month reports they have always been normal except that his cholesterol has been elevated. S/sx of hypersensitivity: denies S/sx of malignancy: denies S/sx of heart failure: denies    O:     No results found for: WBC, HGB, HCT, MCV, PLT    Chemistry   No results found for: NA, K, CL, CO2, BUN, CREATININE, GLU No results found for: CALCIUM, ALKPHOS, AST, ALT, BILITOT     A/P: 1. Medication review: Patient on Humira for ankylosing spondylitis and is tolerating it well with no adverse effects and improved control of disease state. Reviewed the medication with him, including the following: Humira is a TNF blocking agent indicated for ankylosing spondylitis, Crohn's disease, Hidradenitis suppurativa, psoriatic arthritis, plaque psoriasis, ulcerative colitis, and uveitis. The most common adverse effects are infections, headache, and injection site reactions. There is the possibility of an increased risk of malignancy but it is not well understood if this increased risk is due to there medication or the disease state. There are rare cases of pancytopenia and aplastic anemia. No recommendations for changes.     Cameron Brown, PharmD, BCPS, BCACP,  CPP Clinical Pharmacist Practitioner  [facility] [phone]

## 2016-11-01 MED FILL — TELMISARTAN-HCTZ 40-12.5 MG: 40-12.5 | 30 days supply | Qty: 30 | Fill #0

## 2016-11-01 MED FILL — CIALIS 5 MG TABLET: 5 | 30 days supply | Qty: 30 | Fill #5

## 2016-11-07 DIAGNOSIS — I10 Essential (primary) hypertension: Secondary | ICD-10-CM | POA: Diagnosis not present

## 2016-11-28 MED FILL — TADALAFIL 5 MG TABS: 5 | 30 days supply | Qty: 30 | Fill #6

## 2016-11-28 MED FILL — TELMISARTAN-HCTZ 40-12.5 MG: 40-12.5 | 30 days supply | Qty: 30 | Fill #1

## 2016-11-28 MED FILL — HUMIRA PEN 40 MG/0.8ML PNKT: 40 | 28 days supply | Qty: 2 | Fill #8

## 2016-12-05 ENCOUNTER — Encounter (INDEPENDENT_AMBULATORY_CARE_PROVIDER_SITE_OTHER): Payer: 59

## 2016-12-05 ENCOUNTER — Other Ambulatory Visit: Payer: Self-pay | Admitting: Family Medicine

## 2016-12-05 DIAGNOSIS — I10 Essential (primary) hypertension: Secondary | ICD-10-CM

## 2016-12-26 MED FILL — TELMISARTAN-HCTZ 40-12.5 MG: 40-12.5 | 30 days supply | Qty: 30 | Fill #2

## 2016-12-26 MED FILL — HUMIRA PEN 40 MG/0.8ML PNKT: 40 | 28 days supply | Qty: 2 | Fill #9

## 2016-12-27 MED FILL — TADALAFIL 5 MG TABS: 5 | 30 days supply | Qty: 30 | Fill #0

## 2017-01-23 MED FILL — TELMISARTAN-HCTZ 40-12.5 MG: 40-12.5 | 30 days supply | Qty: 30 | Fill #0

## 2017-01-23 MED FILL — TADALAFIL 5 MG TABS: 5 | 30 days supply | Qty: 30 | Fill #1

## 2017-01-23 MED FILL — HUMIRA PEN 40 MG/0.8ML PNKT: 40 | 28 days supply | Qty: 2 | Fill #10

## 2017-02-21 MED FILL — TADALAFIL 5 MG TABS: 5 | 30 days supply | Qty: 30 | Fill #2

## 2017-02-21 MED FILL — TELMISARTAN-HCTZ 40-12.5 MG: 40-12.5 | 30 days supply | Qty: 30 | Fill #1

## 2017-03-22 ENCOUNTER — Other Ambulatory Visit: Payer: Self-pay | Admitting: Pharmacist

## 2017-03-22 MED ORDER — ADALIMUMAB 40 MG/0.8ML ~~LOC~~ AJKT: 1.0000 "pen " | AUTO-INJECTOR | SUBCUTANEOUS | 2 refills | Status: DC

## 2017-03-22 MED FILL — HUMIRA PEN 40 MG/0.8ML PNKT: 40 | 28 days supply | Qty: 2 | Fill #0

## 2017-04-10 DIAGNOSIS — M5400 Panniculitis affecting regions of neck and back, site unspecified: Secondary | ICD-10-CM | POA: Diagnosis not present

## 2017-04-10 DIAGNOSIS — M450 Ankylosing spondylitis of multiple sites in spine: Secondary | ICD-10-CM | POA: Diagnosis not present

## 2017-04-10 DIAGNOSIS — I10 Essential (primary) hypertension: Secondary | ICD-10-CM | POA: Diagnosis not present

## 2017-04-15 DIAGNOSIS — M542 Cervicalgia: Secondary | ICD-10-CM | POA: Diagnosis not present

## 2017-04-15 DIAGNOSIS — Z6825 Body mass index (BMI) 25.0-25.9, adult: Secondary | ICD-10-CM | POA: Diagnosis not present

## 2017-04-15 DIAGNOSIS — M450 Ankylosing spondylitis of multiple sites in spine: Secondary | ICD-10-CM | POA: Diagnosis not present

## 2017-04-15 DIAGNOSIS — E663 Overweight: Secondary | ICD-10-CM | POA: Diagnosis not present

## 2017-04-19 MED FILL — HUMIRA PEN 40 MG/0.8ML PNKT: 40 | 28 days supply | Qty: 2 | Fill #1

## 2017-04-24 MED FILL — TADALAFIL 5 MG TABS: 5 | 30 days supply | Qty: 30 | Fill #3

## 2017-05-10 DIAGNOSIS — I10 Essential (primary) hypertension: Secondary | ICD-10-CM | POA: Diagnosis not present

## 2017-05-22 MED FILL — TADALAFIL 5 MG TABS: 5 | 30 days supply | Qty: 30 | Fill #4

## 2017-05-22 MED FILL — BYSTOLIC 10 MG TABLET: 10 | 90 days supply | Qty: 90 | Fill #0

## 2017-05-22 MED FILL — HUMIRA PEN 40 MG/0.8ML PNKT: 40 | 28 days supply | Qty: 2 | Fill #2

## 2017-06-20 MED FILL — TADALAFIL 5 MG TABS: 5 | 30 days supply | Qty: 30 | Fill #5

## 2017-07-24 MED FILL — TADALAFIL 5 MG TABS: 5 | 30 days supply | Qty: 30 | Fill #6

## 2017-07-24 MED FILL — HUMIRA PEN 40 MG/0.8ML PNKT: 40 | 28 days supply | Qty: 2 | Fill #0

## 2017-08-14 DIAGNOSIS — Z6824 Body mass index (BMI) 24.0-24.9, adult: Secondary | ICD-10-CM | POA: Diagnosis not present

## 2017-08-14 DIAGNOSIS — I10 Essential (primary) hypertension: Secondary | ICD-10-CM | POA: Diagnosis not present

## 2017-08-14 DIAGNOSIS — M130 Polyarthritis, unspecified: Secondary | ICD-10-CM | POA: Diagnosis not present

## 2017-08-14 DIAGNOSIS — K219 Gastro-esophageal reflux disease without esophagitis: Secondary | ICD-10-CM | POA: Diagnosis not present

## 2017-08-14 DIAGNOSIS — E782 Mixed hyperlipidemia: Secondary | ICD-10-CM | POA: Diagnosis not present

## 2017-08-14 DIAGNOSIS — F5221 Male erectile disorder: Secondary | ICD-10-CM | POA: Diagnosis not present

## 2017-08-14 MED FILL — PANTOPRAZOLE SOD DR 40 MG T: 40 | 90 days supply | Qty: 90 | Fill #0

## 2017-08-14 MED FILL — CYCLOBENZAPRINE HCL 5 MG TA: 5 | 20 days supply | Qty: 60 | Fill #0

## 2017-08-23 MED FILL — TADALAFIL 5 MG TABS: 5 | 30 days supply | Qty: 30 | Fill #0

## 2017-08-26 MED FILL — HUMIRA PEN 40 MG/0.8ML PNKT: 40 | 28 days supply | Qty: 2 | Fill #1

## 2017-09-09 MED FILL — ACETAMINOPHEN/COD #3 TABLET: 300-30 | 4 days supply | Qty: 12 | Fill #0

## 2017-09-27 MED FILL — TADALAFIL 5 MG TABS: 5 | 30 days supply | Qty: 30 | Fill #1

## 2017-09-27 MED FILL — HUMIRA PEN 40 MG/0.8ML PNKT: 40 | 28 days supply | Qty: 2 | Fill #2

## 2017-10-16 DIAGNOSIS — Z6824 Body mass index (BMI) 24.0-24.9, adult: Secondary | ICD-10-CM | POA: Diagnosis not present

## 2017-10-16 DIAGNOSIS — M542 Cervicalgia: Secondary | ICD-10-CM | POA: Diagnosis not present

## 2017-10-16 DIAGNOSIS — M450 Ankylosing spondylitis of multiple sites in spine: Secondary | ICD-10-CM | POA: Diagnosis not present

## 2017-10-31 MED FILL — TADALAFIL 5 MG TABS: 5 | 30 days supply | Qty: 30 | Fill #2

## 2017-10-31 MED FILL — BYSTOLIC 10 MG TABLET: 10 | 90 days supply | Qty: 90 | Fill #1

## 2017-11-01 ENCOUNTER — Other Ambulatory Visit: Payer: Self-pay | Admitting: Pharmacist

## 2017-11-01 MED ORDER — ADALIMUMAB 40 MG/0.8ML ~~LOC~~ AJKT: 1.0000 "pen " | AUTO-INJECTOR | SUBCUTANEOUS | 1 refills | Status: DC

## 2017-11-01 MED FILL — HUMIRA PEN 40 MG/0.8ML PNKT: 40 | 28 days supply | Qty: 2 | Fill #0

## 2017-11-01 MED FILL — VIT D2 1.25 MG (50,000 UNIT: 1.25 MG | 28 days supply | Qty: 4 | Fill #0

## 2017-11-04 ENCOUNTER — Ambulatory Visit (INDEPENDENT_AMBULATORY_CARE_PROVIDER_SITE_OTHER): Payer: 59 | Admitting: Pharmacist

## 2017-11-04 DIAGNOSIS — Z79899 Other long term (current) drug therapy: Secondary | ICD-10-CM

## 2017-11-04 NOTE — Progress Notes (Signed)
   S: Patient presents today to the [facility] for review of his specialty medication.  Patient is currently taking Humira for ankylosing spondylitis. Patient is managed by Dr. Dierdre Forth for this.  Adherence: denies any missed doses  Efficacy: reports that it is still working really well for him  Dosing:  Ankylosing spondylitis: SubQ: 40 mg every other week (may continue methotrexate, other nonbiologic DMARDS, corticosteroids, NSAIDs and/or analgesics)  Screening: TB test: completed per patient Hepatitis: completed per patient  Monitoring: S/sx of infection: denies CBC done by Dr. Dierdre Forth and has been WNL S/sx of hypersensitivity: denies S/sx of malignancy: denies S/sx of heart failure: denies    O:     No results found for: WBC, HGB, HCT, MCV, PLT    Chemistry   No results found for: NA, K, CL, CO2, BUN, CREATININE, GLU No results found for: CALCIUM, ALKPHOS, AST, ALT, BILITOT     A/P: 1. Medication review: Patient on Humira for ankylosing spondylitis and is tolerating it well with no adverse effects and continued control of disease state. Reviewed the medication with him, including the following: Humira is a TNF blocking agent indicated for ankylosing spondylitis, Crohn's disease, Hidradenitis suppurativa, psoriatic arthritis, plaque psoriasis, ulcerative colitis, and uveitis. The most common adverse effects are infections, headache, and injection site reactions. There is the possibility of an increased risk of malignancy but it is not well understood if this increased risk is due to there medication or the disease state. There are rare cases of pancytopenia and aplastic anemia. No recommendations for changes.     Alvino Blood, PharmD, BCPS, BCACP, CPP Clinical Pharmacist Practitioner  [phone]

## 2017-11-05 ENCOUNTER — Ambulatory Visit (HOSPITAL_COMMUNITY)
Admission: EM | Admit: 2017-11-05 | Discharge: 2017-11-05 | Disposition: A | Discharge status: Home / Self Care | Payer: 59 | Attending: Family Medicine | Admitting: Family Medicine

## 2017-11-05 ENCOUNTER — Encounter (HOSPITAL_COMMUNITY): Payer: Self-pay

## 2017-11-05 ENCOUNTER — Other Ambulatory Visit: Payer: Self-pay

## 2017-11-05 DIAGNOSIS — B9789 Other viral agents as the cause of diseases classified elsewhere: Secondary | ICD-10-CM

## 2017-11-05 DIAGNOSIS — J069 Acute upper respiratory infection, unspecified: Secondary | ICD-10-CM

## 2017-11-05 DIAGNOSIS — T1501XA Foreign body in cornea, right eye, initial encounter: Secondary | ICD-10-CM | POA: Diagnosis not present

## 2017-11-05 DIAGNOSIS — H10413 Chronic giant papillary conjunctivitis, bilateral: Secondary | ICD-10-CM | POA: Diagnosis not present

## 2017-11-05 DIAGNOSIS — H1033 Unspecified acute conjunctivitis, bilateral: Secondary | ICD-10-CM

## 2017-11-05 HISTORY — DX: Essential (primary) hypertension: I10

## 2017-11-05 MED ORDER — CETIRIZINE-PSEUDOEPHEDRINE ER 5-120 MG PO TB12: 1.0000 | ORAL_TABLET | Freq: Every day | ORAL | 0 refills | Status: AC

## 2017-11-05 NOTE — Discharge Instructions (Signed)
Get plenty of rest and push fluids Use OTC medication as needed for symptomatic relief Zyrtec D prescribed. Take as directed and to completion Please follow up with ophthalmology as soon as possible for further evaluation and management of eye redness. Follow up with PCP if sore throat or cough persist Return or go to ER if you have any new or worsening symptoms such as fever, chills, nausea, vomiting, chest pain, abdominal pain, eye pain, changes in vision, etc..Marland Kitchen

## 2017-11-05 NOTE — ED Triage Notes (Signed)
Pt states he has a sore throat and cough x 5  Days.

## 2017-11-05 NOTE — ED Provider Notes (Signed)
[facility]   [id number] 11/05/17 Arrival Time: 0808  CC: Sore throat and cough  SUBJECTIVE:  Cameron Brown is a 52 y.o. male who presents with sore throat x 5 days and cough for couple of days.  Denies positive sick exposure or precipitating event.  Describes cough as intermittent and dry.  Has tried delsym and theraflu with temporary relief.  Denies worsening factors.  Reports previous symptoms in the past. Complains of sinus pressure, and nasal congestion.  Denies fever, chills, fatigue, rhinorrhea, SOB, wheezing, chest pain, nausea, changes in bowel or bladder habits.    Hx significant for ankylosing spondylitis currently on humira.  Complains of bilateral eye redness x 1-2 days.  Denies precipitating event or trauma.  Denies aggravating or alleviating factors.  Reports hx of iritis, but does not have photophobia or eye pain. Denies pain, vision changes or blurry vision, itchiness, photophobia, or discharge.  Admits to wearing contacts.    ROS: As per HPI.  Past Medical History:  Diagnosis Date  . Ankylosing spondylitis (HCC)   . Arthritis   . Hypertension    Past Surgical History:  Procedure Laterality Date  . ANTERIOR CRUCIATE LIGAMENT REPAIR    . ROTATOR CUFF REPAIR     No Known Allergies No current facility-administered medications on file prior to encounter.    Current Outpatient Medications on File Prior to Encounter  Medication Sig Dispense Refill  . Adalimumab (HUMIRA PEN) 40 MG/0.8ML PNKT Inject 1 pen into the skin every 14 (fourteen) days. 40 mg every 2 weeks subcutaneous 28 2 each 1  . Glucosamine-Chondroit-Vit C-Mn (GLUCOSAMINE CHONDR 1500 COMPLX PO) Take 1 tablet by mouth daily.    . naproxen sodium (ANAPROX) 220 MG tablet Take 220 mg by mouth daily as needed. For pain    . tadalafil (CIALIS) 5 MG tablet Take 1 tablet (5 mg total) by mouth daily. 30 tablet 1    Social History   Socioeconomic History  . Marital status: Single    Spouse name: Not  on file  . Number of children: Not on file  . Years of education: Not on file  . Highest education level: Not on file  Occupational History  . Not on file  Social Needs  . Financial resource strain: Not on file  . Food insecurity:    Worry: Not on file    Inability: Not on file  . Transportation needs:    Medical: Not on file    Non-medical: Not on file  Tobacco Use  . Smoking status: Former Smoker    Packs/day: 0.50    Years: 10.00    Pack years: 5.00    Types: Cigarettes    Start date: 01/30/1991    Last attempt to quit: 01/29/2001    Years since quitting: 16.7  . Smokeless tobacco: Never Used  . Tobacco comment: Cigars prn boredom  Substance and Sexual Activity  . Alcohol use: Yes    Comment: rarely  . Drug use: No  . Sexual activity: Not on file  Lifestyle  . Physical activity:    Days per week: Not on file    Minutes per session: Not on file  . Stress: Not on file  Relationships  . Social connections:    Talks on phone: Not on file    Gets together: Not on file    Attends religious service: Not on file    Active member of club or organization: Not on file    Attends meetings of  clubs or organizations: Not on file    Relationship status: Not on file  . Intimate partner violence:    Fear of current or ex partner: Not on file    Emotionally abused: Not on file    Physically abused: Not on file    Forced sexual activity: Not on file  Other Topics Concern  . Not on file  Social History Narrative  . Not on file   History reviewed. No pertinent family history.   OBJECTIVE:  Vitals:   11/05/17 0825  BP: (!) 145/80  Pulse: 64  Resp: 18  Temp: 98.2 F (36.8 C)  TempSrc: Oral  SpO2: 98%  Weight: 195 lb (88.5 kg)     General appearance: AOx3 in no acute distress; nontoxic appearance HEENT: NCAT; EACs clear, TMs pearly gray with visible cone of light; Conjunctiva injected, PERRL.  EOM grossly intact. No obvious rhinorrhea; tonsils nonerythematous, uvula  midline Neck: supple without LAD Lungs: clear to auscultation bilaterally without adventitious breath sounds Heart: regular rate and rhythm.  Radial pulses 2+ symmetrical bilaterally Skin: warm and dry Psychological: alert and cooperative; normal mood and affect  ASSESSMENT & PLAN:  1. Viral URI with cough   2. Acute conjunctivitis of both eyes, unspecified acute conjunctivitis type    Get plenty of rest and push fluids Use OTC medication as needed for symptomatic relief Zyrtec D prescribed. Take as directed and to completion Please follow up with ophthalmology as soon as possible for further evaluation and management of eye redness. Follow up with PCP if sore throat or cough persist Return or go to ER if you have any new or worsening symptoms such as fever, chills, nausea, vomiting, chest pain, abdominal pain, eye pain, changes in vision, etc...  Reviewed expectations re: course of current medical issues. Questions answered. Outlined signs and symptoms indicating need for more acute intervention. Patient verbalized understanding. After Visit Summary given.          Rennis Harding, PA-C 11/05/17 1012

## 2017-11-07 DIAGNOSIS — H5213 Myopia, bilateral: Secondary | ICD-10-CM | POA: Diagnosis not present

## 2017-11-07 DIAGNOSIS — H52223 Regular astigmatism, bilateral: Secondary | ICD-10-CM | POA: Diagnosis not present

## 2017-11-15 MED FILL — ZYRTEC-D TABLET: 5-120 | 12 days supply | Qty: 12 | Fill #0

## 2017-12-02 MED FILL — TADALAFIL 5 MG TABS: 5 | 30 days supply | Qty: 30 | Fill #3

## 2017-12-12 MED FILL — CYCLOBENZAPRINE HCL 5 MG TA: 5 | 20 days supply | Qty: 60 | Fill #1

## 2017-12-13 DIAGNOSIS — I10 Essential (primary) hypertension: Secondary | ICD-10-CM | POA: Diagnosis not present

## 2017-12-13 DIAGNOSIS — Z6824 Body mass index (BMI) 24.0-24.9, adult: Secondary | ICD-10-CM | POA: Diagnosis not present

## 2017-12-13 DIAGNOSIS — E7800 Pure hypercholesterolemia, unspecified: Secondary | ICD-10-CM | POA: Diagnosis not present

## 2017-12-13 DIAGNOSIS — M450 Ankylosing spondylitis of multiple sites in spine: Secondary | ICD-10-CM | POA: Diagnosis not present

## 2017-12-13 DIAGNOSIS — E785 Hyperlipidemia, unspecified: Secondary | ICD-10-CM | POA: Diagnosis not present

## 2018-01-08 MED FILL — TADALAFIL 5 MG TABS: 5 | 30 days supply | Qty: 30 | Fill #4

## 2018-01-30 MED FILL — HUMIRA PEN 40 MG/0.8ML PNKT: 40 | 28 days supply | Qty: 2 | Fill #1

## 2018-01-30 MED FILL — VIT D2 1.25 MG (50,000 UNIT: 1.25 MG | 28 days supply | Qty: 4 | Fill #1

## 2018-02-01 MED FILL — TADALAFIL 5 MG TABS: 5 | 30 days supply | Qty: 30 | Fill #5

## 2018-03-06 MED FILL — TADALAFIL 5 MG TABS: 5 | 30 days supply | Qty: 30 | Fill #6

## 2018-03-07 ENCOUNTER — Other Ambulatory Visit: Payer: Self-pay | Admitting: Pharmacist

## 2018-03-07 MED ORDER — ADALIMUMAB 40 MG/0.8ML ~~LOC~~ AJKT: 1.0000 "pen " | AUTO-INJECTOR | SUBCUTANEOUS | 3 refills | Status: DC

## 2018-03-07 MED FILL — HUMIRA PEN 40 MG/0.8ML PNKT: 40 | 28 days supply | Qty: 2 | Fill #0

## 2018-04-15 DIAGNOSIS — E785 Hyperlipidemia, unspecified: Secondary | ICD-10-CM | POA: Diagnosis not present

## 2018-04-15 DIAGNOSIS — M450 Ankylosing spondylitis of multiple sites in spine: Secondary | ICD-10-CM | POA: Diagnosis not present

## 2018-04-15 DIAGNOSIS — Z6823 Body mass index (BMI) 23.0-23.9, adult: Secondary | ICD-10-CM | POA: Diagnosis not present

## 2018-04-15 DIAGNOSIS — I10 Essential (primary) hypertension: Secondary | ICD-10-CM | POA: Diagnosis not present

## 2018-04-16 DIAGNOSIS — Z6822 Body mass index (BMI) 22.0-22.9, adult: Secondary | ICD-10-CM | POA: Diagnosis not present

## 2018-04-16 DIAGNOSIS — M450 Ankylosing spondylitis of multiple sites in spine: Secondary | ICD-10-CM | POA: Diagnosis not present

## 2018-04-16 DIAGNOSIS — E7849 Other hyperlipidemia: Secondary | ICD-10-CM | POA: Diagnosis not present

## 2018-04-16 DIAGNOSIS — E559 Vitamin D deficiency, unspecified: Secondary | ICD-10-CM | POA: Diagnosis not present

## 2018-04-16 DIAGNOSIS — Z131 Encounter for screening for diabetes mellitus: Secondary | ICD-10-CM | POA: Diagnosis not present

## 2018-04-16 DIAGNOSIS — M542 Cervicalgia: Secondary | ICD-10-CM | POA: Diagnosis not present

## 2018-04-18 MED FILL — VIT D2 1.25 MG (50,000 UNIT: 1.25 MG | 28 days supply | Qty: 4 | Fill #2

## 2018-04-22 MED FILL — TADALAFIL 5 MG TABS: 5 | 30 days supply | Qty: 30 | Fill #0

## 2018-05-22 MED FILL — VIT D2 1.25 MG (50,000 UNIT: 1.25 MG | 28 days supply | Qty: 4 | Fill #3

## 2018-05-22 MED FILL — TADALAFIL 5 MG TABS: 5 | 30 days supply | Qty: 30 | Fill #1

## 2018-06-25 MED FILL — TADALAFIL 5 MG TABS: 5 | 30 days supply | Qty: 30 | Fill #2

## 2018-06-26 DIAGNOSIS — H1045 Other chronic allergic conjunctivitis: Secondary | ICD-10-CM | POA: Diagnosis not present

## 2018-07-21 MED FILL — TADALAFIL 5 MG TABS: 5 | 30 days supply | Qty: 30 | Fill #3

## 2018-07-22 MED FILL — BYSTOLIC 10 MG TABLET: 10 | 90 days supply | Qty: 90 | Fill #0

## 2018-07-29 DIAGNOSIS — H1045 Other chronic allergic conjunctivitis: Secondary | ICD-10-CM | POA: Diagnosis not present

## 2018-07-29 DIAGNOSIS — H0102A Squamous blepharitis right eye, upper and lower eyelids: Secondary | ICD-10-CM | POA: Diagnosis not present

## 2018-07-29 DIAGNOSIS — H0102B Squamous blepharitis left eye, upper and lower eyelids: Secondary | ICD-10-CM | POA: Diagnosis not present

## 2018-07-29 MED FILL — FLUOROMETHOLONE 0.1% DROPS: 0.1 | 30 days supply | Qty: 10 | Fill #0

## 2018-08-19 DIAGNOSIS — I10 Essential (primary) hypertension: Secondary | ICD-10-CM | POA: Diagnosis not present

## 2018-08-19 DIAGNOSIS — E785 Hyperlipidemia, unspecified: Secondary | ICD-10-CM | POA: Diagnosis not present

## 2018-08-19 DIAGNOSIS — M450 Ankylosing spondylitis of multiple sites in spine: Secondary | ICD-10-CM | POA: Diagnosis not present

## 2018-08-19 MED FILL — VALACYCLOVIR HCL 500 MG TAB: 500 | 30 days supply | Qty: 60 | Fill #0

## 2018-08-28 DIAGNOSIS — H0102B Squamous blepharitis left eye, upper and lower eyelids: Secondary | ICD-10-CM | POA: Diagnosis not present

## 2018-08-28 DIAGNOSIS — H16223 Keratoconjunctivitis sicca, not specified as Sjogren's, bilateral: Secondary | ICD-10-CM | POA: Diagnosis not present

## 2018-08-28 DIAGNOSIS — H0102A Squamous blepharitis right eye, upper and lower eyelids: Secondary | ICD-10-CM | POA: Diagnosis not present

## 2018-08-28 DIAGNOSIS — H1045 Other chronic allergic conjunctivitis: Secondary | ICD-10-CM | POA: Diagnosis not present

## 2018-08-28 MED FILL — XIIDRA 5% EYE DROPS: 5 | 30 days supply | Qty: 60 | Fill #0

## 2018-08-28 MED FILL — FLUOROMETHOLONE 0.1% DROPS: 0.1 | 25 days supply | Qty: 5 | Fill #0

## 2018-08-29 MED FILL — TADALAFIL 5 MG TABS: 5 | 30 days supply | Qty: 30 | Fill #0

## 2018-09-29 MED FILL — TADALAFIL 5 MG TABS: 5 | 30 days supply | Qty: 30 | Fill #1

## 2018-10-02 DIAGNOSIS — H16223 Keratoconjunctivitis sicca, not specified as Sjogren's, bilateral: Secondary | ICD-10-CM | POA: Diagnosis not present

## 2018-10-02 DIAGNOSIS — H0102A Squamous blepharitis right eye, upper and lower eyelids: Secondary | ICD-10-CM | POA: Diagnosis not present

## 2018-10-02 DIAGNOSIS — H0102B Squamous blepharitis left eye, upper and lower eyelids: Secondary | ICD-10-CM | POA: Diagnosis not present

## 2018-10-02 DIAGNOSIS — H40013 Open angle with borderline findings, low risk, bilateral: Secondary | ICD-10-CM | POA: Diagnosis not present

## 2018-10-02 DIAGNOSIS — H1045 Other chronic allergic conjunctivitis: Secondary | ICD-10-CM | POA: Diagnosis not present

## 2018-11-05 DIAGNOSIS — H0102B Squamous blepharitis left eye, upper and lower eyelids: Secondary | ICD-10-CM | POA: Diagnosis not present

## 2018-11-05 DIAGNOSIS — H1045 Other chronic allergic conjunctivitis: Secondary | ICD-10-CM | POA: Diagnosis not present

## 2018-11-05 DIAGNOSIS — H16223 Keratoconjunctivitis sicca, not specified as Sjogren's, bilateral: Secondary | ICD-10-CM | POA: Diagnosis not present

## 2018-11-05 DIAGNOSIS — H0102A Squamous blepharitis right eye, upper and lower eyelids: Secondary | ICD-10-CM | POA: Diagnosis not present

## 2018-12-19 DIAGNOSIS — F4329 Adjustment disorder with other symptoms: Secondary | ICD-10-CM | POA: Diagnosis not present

## 2018-12-19 DIAGNOSIS — M450 Ankylosing spondylitis of multiple sites in spine: Secondary | ICD-10-CM | POA: Diagnosis not present

## 2018-12-19 DIAGNOSIS — E7800 Pure hypercholesterolemia, unspecified: Secondary | ICD-10-CM | POA: Diagnosis not present

## 2018-12-19 DIAGNOSIS — E559 Vitamin D deficiency, unspecified: Secondary | ICD-10-CM | POA: Diagnosis not present

## 2018-12-19 DIAGNOSIS — I10 Essential (primary) hypertension: Secondary | ICD-10-CM | POA: Diagnosis not present

## 2018-12-19 MED FILL — TADALAFIL 5 MG TABS: 5 | 30 days supply | Qty: 30 | Fill #3

## 2018-12-22 MED FILL — CYCLOBENZAPRINE HCL 5 MG TA: 5 | 20 days supply | Qty: 60 | Fill #0

## 2019-01-01 MED FILL — BEPREVE 1.5% EYE DROPS: 1.5 | 25 days supply | Qty: 5 | Fill #0

## 2019-01-21 MED FILL — HUMIRA PEN 40 MG/0.8ML PNKT: 40 | 28 days supply | Qty: 2 | Fill #2

## 2019-01-21 MED FILL — TADALAFIL 5 MG TABS: 5 | 30 days supply | Qty: 30 | Fill #4

## 2019-02-05 DIAGNOSIS — H0102B Squamous blepharitis left eye, upper and lower eyelids: Secondary | ICD-10-CM | POA: Diagnosis not present

## 2019-02-05 DIAGNOSIS — H1045 Other chronic allergic conjunctivitis: Secondary | ICD-10-CM | POA: Diagnosis not present

## 2019-02-05 DIAGNOSIS — H16223 Keratoconjunctivitis sicca, not specified as Sjogren's, bilateral: Secondary | ICD-10-CM | POA: Diagnosis not present

## 2019-02-05 DIAGNOSIS — H0102A Squamous blepharitis right eye, upper and lower eyelids: Secondary | ICD-10-CM | POA: Diagnosis not present

## 2019-02-18 MED FILL — TADALAFIL 5 MG TABS: 5 | 30 days supply | Qty: 30 | Fill #5

## 2019-02-19 MED FILL — BYSTOLIC 10 MG TABLET: 10 | 90 days supply | Qty: 90 | Fill #1

## 2019-03-24 ENCOUNTER — Other Ambulatory Visit: Payer: Self-pay | Admitting: Pharmacist

## 2019-03-24 MED ORDER — HUMIRA PEN 40 MG/0.8ML ~~LOC~~ PNKT
1.0000 | PEN_INJECTOR | SUBCUTANEOUS | 0 refills | Status: DC
Start: 2019-03-24 — End: 2019-08-18

## 2019-03-24 MED FILL — TADALAFIL 5 MG TABS: 5 | 30 days supply | Qty: 30 | Fill #0

## 2019-04-02 MED FILL — HUMIRA PEN 40 MG/0.8ML PNKT: 40 | 28 days supply | Qty: 2 | Fill #0

## 2019-04-07 ENCOUNTER — Other Ambulatory Visit: Payer: Self-pay

## 2019-04-07 ENCOUNTER — Ambulatory Visit (HOSPITAL_BASED_OUTPATIENT_CLINIC_OR_DEPARTMENT_OTHER): Payer: 59 | Admitting: Pharmacist

## 2019-04-07 DIAGNOSIS — Z79899 Other long term (current) drug therapy: Secondary | ICD-10-CM

## 2019-04-07 NOTE — Progress Notes (Signed)
   S: Patient presents today to the [facility] for review of his specialty medication.  Patient is currently taking Humira for ankylosing spondylitis. Patient is managed by Dr. Dierdre Forth for this.  Adherence: denies any missed doses.  Efficacy: reports that it is still working really well for him.  Dosing:  Ankylosing spondylitis: SubQ: 40 mg every other week (may continue methotrexate, other nonbiologic DMARDS, corticosteroids, NSAIDs and/or analgesics)  Screening: TB test: completed per patient Hepatitis: completed per patient  Monitoring: S/sx of infection: denies CBC done by Dr. Dierdre Forth and has been WNL S/sx of hypersensitivity: denies S/sx of malignancy: denies S/sx of heart failure: denies  O:  No results found for: WBC, HGB, HCT, MCV, PLT    Chemistry   No results found for: NA, K, CL, CO2, BUN, CREATININE, GLU No results found for: CALCIUM, ALKPHOS, AST, ALT, BILITOT     A/P: 1. Medication review: Patient on Humira for ankylosing spondylitis and is tolerating it well with no adverse effects and continued control of disease state. Reviewed the medication with him, including the following: Humira is a TNF blocking agent indicated for ankylosing spondylitis, Crohn's disease, Hidradenitis suppurativa, psoriatic arthritis, plaque psoriasis, ulcerative colitis, and uveitis. The most common adverse effects are infections, headache, and injection site reactions. There is the possibility of an increased risk of malignancy but it is not well understood if this increased risk is due to there medication or the disease state. There are rare cases of pancytopenia and aplastic anemia. No recommendations for changes.    Butch Penny, PharmD, CPP Clinical Pharmacist [facility] [phone]

## 2019-04-17 DIAGNOSIS — I10 Essential (primary) hypertension: Secondary | ICD-10-CM | POA: Diagnosis not present

## 2019-04-27 MED FILL — TADALAFIL 5 MG TABS: 5 | 30 days supply | Qty: 30 | Fill #1

## 2019-05-29 MED FILL — VALACYCLOVIR HCL 500 MG TAB: 500 | 30 days supply | Qty: 60 | Fill #2

## 2019-05-29 MED FILL — TADALAFIL 5 MG TABS: 5 | 30 days supply | Qty: 30 | Fill #2

## 2019-06-22 DIAGNOSIS — I10 Essential (primary) hypertension: Secondary | ICD-10-CM | POA: Diagnosis not present

## 2019-06-23 DIAGNOSIS — E782 Mixed hyperlipidemia: Secondary | ICD-10-CM | POA: Diagnosis not present

## 2019-06-23 DIAGNOSIS — M130 Polyarthritis, unspecified: Secondary | ICD-10-CM | POA: Diagnosis not present

## 2019-06-23 DIAGNOSIS — I10 Essential (primary) hypertension: Secondary | ICD-10-CM | POA: Diagnosis not present

## 2019-06-26 MED FILL — TADALAFIL 5 MG TABS: 5 | 30 days supply | Qty: 30 | Fill #3

## 2019-07-24 MED FILL — BYSTOLIC 10 MG TABLET: 10 | 90 days supply | Qty: 90 | Fill #0

## 2019-07-24 MED FILL — TADALAFIL 5 MG TABS: 5 | 30 days supply | Qty: 30 | Fill #4

## 2019-08-04 DIAGNOSIS — H0102B Squamous blepharitis left eye, upper and lower eyelids: Secondary | ICD-10-CM | POA: Diagnosis not present

## 2019-08-04 DIAGNOSIS — H21233 Degeneration of iris (pigmentary), bilateral: Secondary | ICD-10-CM | POA: Diagnosis not present

## 2019-08-04 DIAGNOSIS — H16223 Keratoconjunctivitis sicca, not specified as Sjogren's, bilateral: Secondary | ICD-10-CM | POA: Diagnosis not present

## 2019-08-04 DIAGNOSIS — H1045 Other chronic allergic conjunctivitis: Secondary | ICD-10-CM | POA: Diagnosis not present

## 2019-08-04 DIAGNOSIS — H40013 Open angle with borderline findings, low risk, bilateral: Secondary | ICD-10-CM | POA: Diagnosis not present

## 2019-08-04 DIAGNOSIS — H0102A Squamous blepharitis right eye, upper and lower eyelids: Secondary | ICD-10-CM | POA: Diagnosis not present

## 2019-08-18 ENCOUNTER — Other Ambulatory Visit: Payer: Self-pay | Admitting: Internal Medicine

## 2019-08-18 DIAGNOSIS — M542 Cervicalgia: Secondary | ICD-10-CM | POA: Diagnosis not present

## 2019-08-18 DIAGNOSIS — Z6825 Body mass index (BMI) 25.0-25.9, adult: Secondary | ICD-10-CM | POA: Diagnosis not present

## 2019-08-18 DIAGNOSIS — E663 Overweight: Secondary | ICD-10-CM | POA: Diagnosis not present

## 2019-08-18 DIAGNOSIS — E559 Vitamin D deficiency, unspecified: Secondary | ICD-10-CM | POA: Diagnosis not present

## 2019-08-18 DIAGNOSIS — M450 Ankylosing spondylitis of multiple sites in spine: Secondary | ICD-10-CM | POA: Diagnosis not present

## 2019-08-18 MED FILL — TADALAFIL 5 MG TABS: 5 | 30 days supply | Qty: 30 | Fill #5

## 2019-09-02 MED FILL — HUMIRA PEN 40 MG/0.8ML PNKT: 40 | 28 days supply | Qty: 2 | Fill #0

## 2019-09-24 ENCOUNTER — Other Ambulatory Visit: Payer: Self-pay | Admitting: Pharmacist

## 2019-09-24 MED ORDER — HUMIRA PEN 40 MG/0.8ML ~~LOC~~ PNKT: PEN_INJECTOR | SUBCUTANEOUS | 3 refills | Status: DC

## 2019-09-29 MED FILL — HUMIRA PEN 40 MG/0.8ML PNKT: 40 | 28 days supply | Qty: 2 | Fill #0

## 2019-09-30 MED FILL — TADALAFIL 5 MG TABS: 5 | 30 days supply | Qty: 30 | Fill #6

## 2019-10-26 MED FILL — HUMIRA PEN 40 MG/0.8ML PNKT: 40 | 28 days supply | Qty: 2 | Fill #1

## 2019-10-27 MED FILL — NEBIVOLOL HCL 20 MG TABS: 20 | 90 days supply | Qty: 90 | Fill #0

## 2019-12-08 DIAGNOSIS — Z20822 Contact with and (suspected) exposure to covid-19: Secondary | ICD-10-CM | POA: Diagnosis not present

## 2019-12-17 MED FILL — HUMIRA PEN 40 MG/0.8ML PNKT: 40 | 28 days supply | Qty: 2 | Fill #2

## 2019-12-22 ENCOUNTER — Other Ambulatory Visit (HOSPITAL_COMMUNITY): Payer: Self-pay | Admitting: Family Medicine

## 2019-12-22 DIAGNOSIS — E785 Hyperlipidemia, unspecified: Secondary | ICD-10-CM | POA: Diagnosis not present

## 2019-12-22 DIAGNOSIS — R635 Abnormal weight gain: Secondary | ICD-10-CM | POA: Diagnosis not present

## 2019-12-22 DIAGNOSIS — E559 Vitamin D deficiency, unspecified: Secondary | ICD-10-CM | POA: Diagnosis not present

## 2019-12-22 DIAGNOSIS — I10 Essential (primary) hypertension: Secondary | ICD-10-CM | POA: Diagnosis not present

## 2019-12-22 MED FILL — VALACYCLOVIR HCL 500 MG TAB: 500 | 30 days supply | Qty: 60 | Fill #0

## 2019-12-22 MED FILL — TADALAFIL 5 MG TABS: 5 | 30 days supply | Qty: 30 | Fill #0

## 2020-01-18 MED FILL — HUMIRA PEN 40 MG/0.8ML PNKT: 40 | 28 days supply | Qty: 2 | Fill #3

## 2020-01-19 ENCOUNTER — Other Ambulatory Visit (HOSPITAL_COMMUNITY): Payer: Self-pay | Admitting: Family Medicine

## 2020-01-19 MED FILL — TADALAFIL 5 MG TABS: 5 | 30 days supply | Qty: 30 | Fill #1

## 2020-01-20 MED FILL — NEBIVOLOL HCL 20 MG TABS: 20 | 90 days supply | Qty: 90 | Fill #0

## 2020-02-11 ENCOUNTER — Other Ambulatory Visit: Payer: Self-pay | Admitting: Pharmacist

## 2020-02-11 MED ORDER — HUMIRA PEN 40 MG/0.8ML ~~LOC~~ PNKT: PEN_INJECTOR | SUBCUTANEOUS | 3 refills | Status: DC

## 2020-02-18 DIAGNOSIS — Z79899 Other long term (current) drug therapy: Secondary | ICD-10-CM | POA: Diagnosis not present

## 2020-02-18 DIAGNOSIS — E663 Overweight: Secondary | ICD-10-CM | POA: Diagnosis not present

## 2020-02-18 DIAGNOSIS — E559 Vitamin D deficiency, unspecified: Secondary | ICD-10-CM | POA: Diagnosis not present

## 2020-02-18 DIAGNOSIS — Z6825 Body mass index (BMI) 25.0-25.9, adult: Secondary | ICD-10-CM | POA: Diagnosis not present

## 2020-02-18 DIAGNOSIS — M542 Cervicalgia: Secondary | ICD-10-CM | POA: Diagnosis not present

## 2020-02-18 DIAGNOSIS — M450 Ankylosing spondylitis of multiple sites in spine: Secondary | ICD-10-CM | POA: Diagnosis not present

## 2020-02-18 MED FILL — TADALAFIL 5 MG TABS: 5 | 30 days supply | Qty: 30 | Fill #2

## 2020-02-22 DIAGNOSIS — U099 Post covid-19 condition, unspecified: Secondary | ICD-10-CM | POA: Diagnosis not present

## 2020-02-22 DIAGNOSIS — I10 Essential (primary) hypertension: Secondary | ICD-10-CM | POA: Diagnosis not present

## 2020-02-22 DIAGNOSIS — E782 Mixed hyperlipidemia: Secondary | ICD-10-CM | POA: Diagnosis not present

## 2020-02-22 DIAGNOSIS — M130 Polyarthritis, unspecified: Secondary | ICD-10-CM | POA: Diagnosis not present

## 2020-02-22 MED FILL — HUMIRA PEN 40 MG/0.8ML PNKT: 40 | 28 days supply | Qty: 2 | Fill #0

## 2020-03-21 MED FILL — HUMIRA PEN 40 MG/0.8ML PNKT: 40 | 28 days supply | Qty: 2 | Fill #1

## 2020-03-22 MED FILL — TADALAFIL 5 MG TABS: 5 | 30 days supply | Qty: 30 | Fill #3

## 2020-03-23 ENCOUNTER — Other Ambulatory Visit (HOSPITAL_COMMUNITY): Payer: Self-pay | Admitting: Family Medicine

## 2020-03-23 MED FILL — PANTOPRAZOLE SOD DR 40 MG T: 40 | 90 days supply | Qty: 90 | Fill #0

## 2020-04-19 MED FILL — TADALAFIL 5 MG TABS: 5 | 30 days supply | Qty: 30 | Fill #4

## 2020-04-28 ENCOUNTER — Other Ambulatory Visit (HOSPITAL_COMMUNITY): Payer: Self-pay

## 2020-05-02 ENCOUNTER — Other Ambulatory Visit (HOSPITAL_COMMUNITY): Payer: Self-pay

## 2020-05-02 MED FILL — Nebivolol HCl Tab 20 MG (Base Equivalent): ORAL | 90 days supply | Qty: 90 | Fill #0 | Status: AC

## 2020-05-16 ENCOUNTER — Other Ambulatory Visit (HOSPITAL_COMMUNITY): Payer: Self-pay

## 2020-05-16 MED FILL — Tadalafil Tab 5 MG: ORAL | 30 days supply | Qty: 30 | Fill #0 | Status: AC

## 2020-05-17 ENCOUNTER — Other Ambulatory Visit (HOSPITAL_COMMUNITY): Payer: Self-pay

## 2020-05-20 DIAGNOSIS — E782 Mixed hyperlipidemia: Secondary | ICD-10-CM | POA: Diagnosis not present

## 2020-05-20 DIAGNOSIS — M130 Polyarthritis, unspecified: Secondary | ICD-10-CM | POA: Diagnosis not present

## 2020-05-20 DIAGNOSIS — I10 Essential (primary) hypertension: Secondary | ICD-10-CM | POA: Diagnosis not present

## 2020-05-23 DIAGNOSIS — I10 Essential (primary) hypertension: Secondary | ICD-10-CM | POA: Diagnosis not present

## 2020-05-23 DIAGNOSIS — E782 Mixed hyperlipidemia: Secondary | ICD-10-CM | POA: Diagnosis not present

## 2020-05-23 DIAGNOSIS — M130 Polyarthritis, unspecified: Secondary | ICD-10-CM | POA: Diagnosis not present

## 2020-05-23 DIAGNOSIS — U099 Post covid-19 condition, unspecified: Secondary | ICD-10-CM | POA: Diagnosis not present

## 2020-05-23 DIAGNOSIS — M450 Ankylosing spondylitis of multiple sites in spine: Secondary | ICD-10-CM | POA: Diagnosis not present

## 2020-05-24 ENCOUNTER — Telehealth: Payer: Self-pay | Admitting: Pharmacist

## 2020-05-24 NOTE — Telephone Encounter (Signed)
Called patient to schedule an appointment for the [place] [employer] [facility]. I was unable to reach the patient so I left a HIPAA-compliant message requesting that the patient return my call.   Luke Van Ausdall, PharmD, BCACP, CPP Clinical Pharmacist [facility] [phone]  

## 2020-05-26 ENCOUNTER — Other Ambulatory Visit (HOSPITAL_COMMUNITY): Payer: Self-pay

## 2020-06-23 ENCOUNTER — Other Ambulatory Visit (HOSPITAL_COMMUNITY): Payer: Self-pay

## 2020-06-23 MED FILL — Tadalafil Tab 5 MG: ORAL | 30 days supply | Qty: 30 | Fill #1 | Status: AC

## 2020-06-24 ENCOUNTER — Other Ambulatory Visit (HOSPITAL_COMMUNITY): Payer: Self-pay

## 2020-06-29 ENCOUNTER — Other Ambulatory Visit (HOSPITAL_COMMUNITY): Payer: Self-pay

## 2020-07-22 ENCOUNTER — Other Ambulatory Visit (HOSPITAL_COMMUNITY): Payer: Self-pay | Admitting: Family Medicine

## 2020-07-22 ENCOUNTER — Other Ambulatory Visit (HOSPITAL_COMMUNITY): Payer: Self-pay

## 2020-07-25 ENCOUNTER — Other Ambulatory Visit (HOSPITAL_COMMUNITY): Payer: Self-pay

## 2020-07-25 MED ORDER — PANTOPRAZOLE SODIUM 40 MG PO TBEC
40.0000 mg | DELAYED_RELEASE_TABLET | Freq: Every day | ORAL | 2 refills | Status: AC
  Filled 2020-07-25: qty 90, 90d supply, fill #0

## 2020-07-25 MED ORDER — NEBIVOLOL HCL 20 MG PO TABS
20.0000 mg | ORAL_TABLET | Freq: Every day | ORAL | 0 refills | Status: DC
  Filled 2020-07-25: qty 90, 90d supply, fill #0

## 2020-07-27 ENCOUNTER — Other Ambulatory Visit (HOSPITAL_COMMUNITY): Payer: Self-pay

## 2020-07-27 MED ORDER — TADALAFIL 5 MG PO TABS
5.0000 mg | ORAL_TABLET | Freq: Every day | ORAL | 5 refills | Status: DC
  Filled 2020-07-27: qty 30, 30d supply, fill #0
  Filled 2020-08-17: qty 30, 30d supply, fill #1
  Filled 2020-09-20: qty 30, 30d supply, fill #2
  Filled 2020-10-20: qty 30, 30d supply, fill #3

## 2020-08-17 ENCOUNTER — Other Ambulatory Visit (HOSPITAL_COMMUNITY): Payer: Self-pay

## 2020-08-17 DIAGNOSIS — M450 Ankylosing spondylitis of multiple sites in spine: Secondary | ICD-10-CM | POA: Diagnosis not present

## 2020-08-17 DIAGNOSIS — E559 Vitamin D deficiency, unspecified: Secondary | ICD-10-CM | POA: Diagnosis not present

## 2020-08-17 DIAGNOSIS — E663 Overweight: Secondary | ICD-10-CM | POA: Diagnosis not present

## 2020-08-17 DIAGNOSIS — Z79899 Other long term (current) drug therapy: Secondary | ICD-10-CM | POA: Diagnosis not present

## 2020-08-17 DIAGNOSIS — H2013 Chronic iridocyclitis, bilateral: Secondary | ICD-10-CM | POA: Diagnosis not present

## 2020-08-17 DIAGNOSIS — Z6825 Body mass index (BMI) 25.0-25.9, adult: Secondary | ICD-10-CM | POA: Diagnosis not present

## 2020-08-17 DIAGNOSIS — M542 Cervicalgia: Secondary | ICD-10-CM | POA: Diagnosis not present

## 2020-08-17 MED ORDER — CYCLOBENZAPRINE HCL 5 MG PO TABS
ORAL_TABLET | ORAL | 3 refills | Status: AC
  Filled 2020-08-17: qty 30, 30d supply, fill #0

## 2020-09-13 ENCOUNTER — Other Ambulatory Visit (HOSPITAL_COMMUNITY): Payer: Self-pay

## 2020-09-13 MED FILL — Valacyclovir HCl Tab 500 MG: ORAL | 30 days supply | Qty: 60 | Fill #0 | Status: AC

## 2020-09-20 ENCOUNTER — Other Ambulatory Visit (HOSPITAL_COMMUNITY): Payer: Self-pay

## 2020-09-22 DIAGNOSIS — E785 Hyperlipidemia, unspecified: Secondary | ICD-10-CM | POA: Diagnosis not present

## 2020-09-22 DIAGNOSIS — E782 Mixed hyperlipidemia: Secondary | ICD-10-CM | POA: Diagnosis not present

## 2020-09-22 DIAGNOSIS — M130 Polyarthritis, unspecified: Secondary | ICD-10-CM | POA: Diagnosis not present

## 2020-09-22 DIAGNOSIS — I10 Essential (primary) hypertension: Secondary | ICD-10-CM | POA: Diagnosis not present

## 2020-10-20 ENCOUNTER — Other Ambulatory Visit (HOSPITAL_COMMUNITY): Payer: Self-pay

## 2020-10-20 MED FILL — Adalimumab Auto-injector Kit 40 MG/0.8ML: SUBCUTANEOUS | 28 days supply | Qty: 2 | Fill #0 | Status: CN

## 2020-10-21 ENCOUNTER — Other Ambulatory Visit (HOSPITAL_COMMUNITY): Payer: Self-pay

## 2020-10-24 ENCOUNTER — Other Ambulatory Visit (HOSPITAL_COMMUNITY): Payer: Self-pay

## 2020-10-25 ENCOUNTER — Other Ambulatory Visit (HOSPITAL_COMMUNITY): Payer: Self-pay

## 2020-10-26 ENCOUNTER — Other Ambulatory Visit (HOSPITAL_COMMUNITY): Payer: Self-pay

## 2020-10-27 ENCOUNTER — Other Ambulatory Visit (HOSPITAL_COMMUNITY): Payer: Self-pay

## 2020-10-28 ENCOUNTER — Other Ambulatory Visit (HOSPITAL_COMMUNITY): Payer: Self-pay

## 2020-10-28 MED FILL — Adalimumab Auto-injector Kit 40 MG/0.8ML: SUBCUTANEOUS | 28 days supply | Qty: 2 | Fill #0 | Status: AC

## 2020-11-01 ENCOUNTER — Other Ambulatory Visit (HOSPITAL_COMMUNITY): Payer: Self-pay

## 2020-11-02 ENCOUNTER — Other Ambulatory Visit (HOSPITAL_COMMUNITY): Payer: Self-pay

## 2020-11-02 MED ORDER — NEBIVOLOL HCL 20 MG PO TABS
20.0000 mg | ORAL_TABLET | Freq: Every day | ORAL | 1 refills | Status: DC
  Filled 2020-11-02: qty 90, 90d supply, fill #0
  Filled 2021-01-27: qty 90, 90d supply, fill #1

## 2020-11-03 ENCOUNTER — Other Ambulatory Visit (HOSPITAL_COMMUNITY): Payer: Self-pay

## 2020-11-14 ENCOUNTER — Other Ambulatory Visit (HOSPITAL_COMMUNITY): Payer: Self-pay

## 2020-11-17 ENCOUNTER — Other Ambulatory Visit (HOSPITAL_COMMUNITY): Payer: Self-pay

## 2020-11-18 ENCOUNTER — Other Ambulatory Visit (HOSPITAL_COMMUNITY): Payer: Self-pay

## 2020-11-18 MED ORDER — TADALAFIL 5 MG PO TABS
5.0000 mg | ORAL_TABLET | Freq: Every day | ORAL | 5 refills | Status: DC
  Filled 2020-11-18: qty 30, 30d supply, fill #0
  Filled 2020-12-15: qty 30, 30d supply, fill #1
  Filled 2021-01-06 – 2021-01-12 (×2): qty 30, 30d supply, fill #2
  Filled 2021-02-07: qty 30, 30d supply, fill #3
  Filled 2021-03-06: qty 30, 30d supply, fill #4
  Filled 2021-04-06: qty 30, 30d supply, fill #5

## 2020-11-19 ENCOUNTER — Other Ambulatory Visit (HOSPITAL_COMMUNITY): Payer: Self-pay

## 2020-11-21 ENCOUNTER — Other Ambulatory Visit (HOSPITAL_COMMUNITY): Payer: Self-pay

## 2020-11-21 MED FILL — Adalimumab Auto-injector Kit 40 MG/0.8ML: SUBCUTANEOUS | 28 days supply | Qty: 2 | Fill #1 | Status: AC

## 2020-11-24 ENCOUNTER — Other Ambulatory Visit (HOSPITAL_COMMUNITY): Payer: Self-pay

## 2020-12-01 ENCOUNTER — Other Ambulatory Visit (HOSPITAL_COMMUNITY): Payer: Self-pay

## 2020-12-15 ENCOUNTER — Other Ambulatory Visit (HOSPITAL_COMMUNITY): Payer: Self-pay

## 2020-12-20 ENCOUNTER — Other Ambulatory Visit (HOSPITAL_COMMUNITY): Payer: Self-pay

## 2020-12-20 ENCOUNTER — Telehealth: Payer: Self-pay | Admitting: Pharmacist

## 2020-12-20 MED ORDER — HUMIRA PEN 40 MG/0.8ML ~~LOC~~ PNKT
PEN_INJECTOR | SUBCUTANEOUS | 4 refills | Status: DC
  Filled 2020-12-23 – 2021-02-07 (×2): qty 2, 28d supply, fill #0
  Filled 2021-03-24: qty 2, 30d supply, fill #0

## 2020-12-20 NOTE — Telephone Encounter (Signed)
Called patient to schedule an appointment for the [place] [facility] [employer] [facility]. I was unable to reach the patient so I left a HIPAA-compliant message requesting that the patient return my call.   Luke Van Ausdall, PharmD, BCACP, CPP Clinical Pharmacist [facility] [phone]  

## 2020-12-23 ENCOUNTER — Other Ambulatory Visit (HOSPITAL_COMMUNITY): Payer: Self-pay

## 2020-12-27 ENCOUNTER — Other Ambulatory Visit (HOSPITAL_COMMUNITY): Payer: Self-pay

## 2021-01-06 ENCOUNTER — Other Ambulatory Visit (HOSPITAL_COMMUNITY): Payer: Self-pay

## 2021-01-06 ENCOUNTER — Other Ambulatory Visit (HOSPITAL_COMMUNITY): Payer: Self-pay | Admitting: Family Medicine

## 2021-01-09 ENCOUNTER — Other Ambulatory Visit (HOSPITAL_COMMUNITY): Payer: Self-pay

## 2021-01-12 ENCOUNTER — Other Ambulatory Visit (HOSPITAL_COMMUNITY): Payer: Self-pay

## 2021-01-19 DIAGNOSIS — E559 Vitamin D deficiency, unspecified: Secondary | ICD-10-CM | POA: Diagnosis not present

## 2021-01-19 DIAGNOSIS — E782 Mixed hyperlipidemia: Secondary | ICD-10-CM | POA: Diagnosis not present

## 2021-01-19 DIAGNOSIS — E785 Hyperlipidemia, unspecified: Secondary | ICD-10-CM | POA: Diagnosis not present

## 2021-01-19 DIAGNOSIS — I10 Essential (primary) hypertension: Secondary | ICD-10-CM | POA: Diagnosis not present

## 2021-01-20 ENCOUNTER — Other Ambulatory Visit (HOSPITAL_COMMUNITY): Payer: Self-pay

## 2021-01-20 DIAGNOSIS — M130 Polyarthritis, unspecified: Secondary | ICD-10-CM | POA: Diagnosis not present

## 2021-01-20 DIAGNOSIS — E782 Mixed hyperlipidemia: Secondary | ICD-10-CM | POA: Diagnosis not present

## 2021-01-20 DIAGNOSIS — I10 Essential (primary) hypertension: Secondary | ICD-10-CM | POA: Diagnosis not present

## 2021-01-20 MED ORDER — VALACYCLOVIR HCL 500 MG PO TABS
ORAL_TABLET | ORAL | 4 refills | Status: DC
  Filled 2021-01-20: qty 60, 30d supply, fill #0
  Filled 2021-12-25: qty 60, 30d supply, fill #1

## 2021-01-20 MED ORDER — VITAMIN D (ERGOCALCIFEROL) 1.25 MG (50000 UNIT) PO CAPS
ORAL_CAPSULE | ORAL | 3 refills | Status: AC
  Filled 2021-01-20: qty 12, 84d supply, fill #0

## 2021-01-27 ENCOUNTER — Other Ambulatory Visit (HOSPITAL_COMMUNITY): Payer: Self-pay

## 2021-01-31 ENCOUNTER — Other Ambulatory Visit (HOSPITAL_COMMUNITY): Payer: Self-pay

## 2021-02-07 ENCOUNTER — Other Ambulatory Visit (HOSPITAL_COMMUNITY): Payer: Self-pay

## 2021-02-08 ENCOUNTER — Other Ambulatory Visit (HOSPITAL_COMMUNITY): Payer: Self-pay

## 2021-02-10 ENCOUNTER — Other Ambulatory Visit (HOSPITAL_COMMUNITY): Payer: Self-pay

## 2021-02-17 ENCOUNTER — Other Ambulatory Visit (HOSPITAL_COMMUNITY): Payer: Self-pay

## 2021-02-17 DIAGNOSIS — Z111 Encounter for screening for respiratory tuberculosis: Secondary | ICD-10-CM | POA: Diagnosis not present

## 2021-02-17 DIAGNOSIS — H2013 Chronic iridocyclitis, bilateral: Secondary | ICD-10-CM | POA: Diagnosis not present

## 2021-02-17 DIAGNOSIS — M79602 Pain in left arm: Secondary | ICD-10-CM | POA: Diagnosis not present

## 2021-02-17 DIAGNOSIS — M450 Ankylosing spondylitis of multiple sites in spine: Secondary | ICD-10-CM | POA: Diagnosis not present

## 2021-02-17 DIAGNOSIS — Z6824 Body mass index (BMI) 24.0-24.9, adult: Secondary | ICD-10-CM | POA: Diagnosis not present

## 2021-02-17 DIAGNOSIS — Z79899 Other long term (current) drug therapy: Secondary | ICD-10-CM | POA: Diagnosis not present

## 2021-02-17 DIAGNOSIS — E559 Vitamin D deficiency, unspecified: Secondary | ICD-10-CM | POA: Diagnosis not present

## 2021-02-17 DIAGNOSIS — M542 Cervicalgia: Secondary | ICD-10-CM | POA: Diagnosis not present

## 2021-02-17 MED ORDER — CYCLOBENZAPRINE HCL 5 MG PO TABS
ORAL_TABLET | ORAL | 3 refills | Status: DC
  Filled 2021-02-17 – 2021-03-24 (×2): qty 90, 90d supply, fill #0
  Filled 2021-07-27: qty 90, 90d supply, fill #1

## 2021-02-20 ENCOUNTER — Other Ambulatory Visit (HOSPITAL_COMMUNITY): Payer: Self-pay

## 2021-02-20 ENCOUNTER — Telehealth: Payer: Self-pay | Admitting: Pharmacist

## 2021-02-20 NOTE — Telephone Encounter (Signed)
Called patient to schedule an appointment for the [place] [employer] [facility]. I was unable to reach the patient so I left a HIPAA-compliant message requesting that the patient return my call.   Luke Van Ausdall, PharmD, BCACP, CPP Clinical Pharmacist [facility] [phone]  

## 2021-02-27 ENCOUNTER — Other Ambulatory Visit (HOSPITAL_COMMUNITY): Payer: Self-pay

## 2021-03-01 ENCOUNTER — Other Ambulatory Visit (HOSPITAL_COMMUNITY): Payer: Self-pay

## 2021-03-03 ENCOUNTER — Telehealth: Payer: Self-pay | Admitting: Pharmacist

## 2021-03-03 NOTE — Telephone Encounter (Signed)
Called patient to schedule an appointment for the [place] [employer] [facility] [employer] [facility]. I was unable to reach the patient so I left a HIPAA-compliant message requesting that the patient return my call.   Luke Van Ausdall, PharmD, BCACP, CPP Clinical Pharmacist [facility] [phone]  

## 2021-03-06 ENCOUNTER — Other Ambulatory Visit (HOSPITAL_COMMUNITY): Payer: Self-pay

## 2021-03-07 ENCOUNTER — Other Ambulatory Visit (HOSPITAL_COMMUNITY): Payer: Self-pay

## 2021-03-24 ENCOUNTER — Other Ambulatory Visit (HOSPITAL_COMMUNITY): Payer: Self-pay

## 2021-03-24 MED ORDER — HUMIRA PEN 40 MG/0.8ML ~~LOC~~ PNKT
PEN_INJECTOR | SUBCUTANEOUS | 4 refills | Status: DC
  Filled 2021-03-24: qty 2, 30d supply, fill #0

## 2021-03-27 ENCOUNTER — Other Ambulatory Visit: Payer: Self-pay

## 2021-03-27 ENCOUNTER — Ambulatory Visit: Payer: 59 | Attending: Pharmacist | Admitting: Pharmacist

## 2021-03-27 ENCOUNTER — Other Ambulatory Visit (HOSPITAL_COMMUNITY): Payer: Self-pay

## 2021-03-27 DIAGNOSIS — Z79899 Other long term (current) drug therapy: Secondary | ICD-10-CM

## 2021-03-27 MED ORDER — HUMIRA PEN 40 MG/0.8ML ~~LOC~~ PNKT
PEN_INJECTOR | SUBCUTANEOUS | 4 refills | Status: DC
  Filled 2021-03-28: qty 2, 28d supply, fill #0
  Filled 2021-04-17: qty 2, 28d supply, fill #1
  Filled 2021-05-22: qty 2, 28d supply, fill #2
  Filled 2021-06-13: qty 2, 28d supply, fill #3
  Filled 2021-07-11: qty 2, 28d supply, fill #4

## 2021-03-27 NOTE — Progress Notes (Signed)
° °  S: Patient presents today to the [place] [employer] [facility] [employer] [facility] for review of his specialty medication.  Patient is currently taking Humira for ankylosing spondylitis. Patient is managed by Dr. Beekman for this.  Adherence: denies any missed doses.  Efficacy: reports that it is still working really well for him.  Dosing:  Ankylosing spondylitis: SubQ: 40 mg every other week (may continue methotrexate, other nonbiologic DMARDS, corticosteroids, NSAIDs and/or analgesics)  Screening: TB test: completed per patient Hepatitis: completed per patient  Monitoring: S/sx of infection: denies CBC done by Dr. Beekman and has been WNL S/sx of hypersensitivity: denies S/sx of malignancy: denies S/sx of heart failure: denies  O:  No results found for: "WBC", "HGB", "HCT", "MCV", "PLT"    Chemistry   No results found for: "NA", "K", "CL", "CO2", "BUN", "CREATININE", "GLU" No results found for: "CALCIUM", "ALKPHOS", "AST", "ALT", "BILITOT"     A/P: 1. Medication review: Patient on Humira for ankylosing spondylitis and is tolerating it well with no adverse effects and continued control of disease state. Reviewed the medication with him, including the following: Humira is a TNF blocking agent indicated for ankylosing spondylitis, Crohn's disease, Hidradenitis suppurativa, psoriatic arthritis, plaque psoriasis, ulcerative colitis, and uveitis. The most common adverse effects are infections, headache, and injection site reactions. There is the possibility of an increased risk of malignancy but it is not well understood if this increased risk is due to there medication or the disease state. There are rare cases of pancytopenia and aplastic anemia. No recommendations for changes.   Luke Van Ausdall, PharmD, BCACP, CPP Clinical Pharmacist [facility] [phone]  

## 2021-03-28 ENCOUNTER — Other Ambulatory Visit (HOSPITAL_COMMUNITY): Payer: Self-pay

## 2021-03-29 ENCOUNTER — Other Ambulatory Visit (HOSPITAL_COMMUNITY): Payer: Self-pay

## 2021-04-05 ENCOUNTER — Other Ambulatory Visit (HOSPITAL_COMMUNITY): Payer: Self-pay

## 2021-04-06 ENCOUNTER — Other Ambulatory Visit (HOSPITAL_COMMUNITY): Payer: Self-pay

## 2021-04-17 ENCOUNTER — Other Ambulatory Visit (HOSPITAL_COMMUNITY): Payer: Self-pay

## 2021-04-24 ENCOUNTER — Other Ambulatory Visit (HOSPITAL_COMMUNITY): Payer: Self-pay

## 2021-05-04 ENCOUNTER — Other Ambulatory Visit (HOSPITAL_COMMUNITY): Payer: Self-pay

## 2021-05-04 MED ORDER — NEBIVOLOL HCL 20 MG PO TABS
20.0000 mg | ORAL_TABLET | Freq: Every day | ORAL | 1 refills | Status: DC
  Filled 2021-05-04: qty 90, 90d supply, fill #0
  Filled 2021-08-16: qty 90, 90d supply, fill #1

## 2021-05-04 MED ORDER — TADALAFIL 5 MG PO TABS
5.0000 mg | ORAL_TABLET | Freq: Every day | ORAL | 5 refills | Status: DC
  Filled 2021-05-04: qty 30, 30d supply, fill #0
  Filled 2021-05-31: qty 30, 30d supply, fill #1
  Filled 2021-07-03: qty 30, 30d supply, fill #2
  Filled 2021-07-27: qty 30, 30d supply, fill #3
  Filled 2021-08-28: qty 30, 30d supply, fill #4
  Filled 2021-09-26: qty 30, 30d supply, fill #5

## 2021-05-05 ENCOUNTER — Other Ambulatory Visit (HOSPITAL_COMMUNITY): Payer: Self-pay

## 2021-05-18 ENCOUNTER — Other Ambulatory Visit (HOSPITAL_COMMUNITY): Payer: Self-pay

## 2021-05-22 ENCOUNTER — Other Ambulatory Visit (HOSPITAL_COMMUNITY): Payer: Self-pay

## 2021-05-23 ENCOUNTER — Other Ambulatory Visit (HOSPITAL_COMMUNITY): Payer: Self-pay

## 2021-05-31 ENCOUNTER — Other Ambulatory Visit (HOSPITAL_COMMUNITY): Payer: Self-pay

## 2021-06-13 ENCOUNTER — Other Ambulatory Visit (HOSPITAL_COMMUNITY): Payer: Self-pay

## 2021-06-20 ENCOUNTER — Other Ambulatory Visit (HOSPITAL_COMMUNITY): Payer: Self-pay

## 2021-06-27 DIAGNOSIS — M70942 Unspecified soft tissue disorder related to use, overuse and pressure, left hand: Secondary | ICD-10-CM | POA: Diagnosis not present

## 2021-06-27 DIAGNOSIS — I10 Essential (primary) hypertension: Secondary | ICD-10-CM | POA: Diagnosis not present

## 2021-06-27 DIAGNOSIS — Z6824 Body mass index (BMI) 24.0-24.9, adult: Secondary | ICD-10-CM | POA: Diagnosis not present

## 2021-06-27 DIAGNOSIS — M450 Ankylosing spondylitis of multiple sites in spine: Secondary | ICD-10-CM | POA: Diagnosis not present

## 2021-06-27 DIAGNOSIS — E559 Vitamin D deficiency, unspecified: Secondary | ICD-10-CM | POA: Diagnosis not present

## 2021-07-01 ENCOUNTER — Other Ambulatory Visit (HOSPITAL_COMMUNITY): Payer: Self-pay

## 2021-07-01 MED ORDER — VITAMIN D (ERGOCALCIFEROL) 1.25 MG (50000 UNIT) PO CAPS
50000.0000 [IU] | ORAL_CAPSULE | ORAL | 3 refills | Status: AC
  Filled 2021-07-01: qty 12, 84d supply, fill #0
  Filled 2021-10-26: qty 12, 84d supply, fill #1
  Filled 2021-11-29: qty 12, 84d supply, fill #2

## 2021-07-03 ENCOUNTER — Other Ambulatory Visit (HOSPITAL_COMMUNITY): Payer: Self-pay

## 2021-07-04 ENCOUNTER — Other Ambulatory Visit (HOSPITAL_COMMUNITY): Payer: Self-pay

## 2021-07-11 ENCOUNTER — Other Ambulatory Visit (HOSPITAL_COMMUNITY): Payer: Self-pay

## 2021-07-19 ENCOUNTER — Other Ambulatory Visit (HOSPITAL_COMMUNITY): Payer: Self-pay

## 2021-07-27 ENCOUNTER — Other Ambulatory Visit (HOSPITAL_COMMUNITY): Payer: Self-pay

## 2021-08-10 ENCOUNTER — Other Ambulatory Visit (HOSPITAL_COMMUNITY): Payer: Self-pay

## 2021-08-14 ENCOUNTER — Other Ambulatory Visit: Payer: Self-pay | Admitting: Pharmacist

## 2021-08-14 ENCOUNTER — Other Ambulatory Visit (HOSPITAL_COMMUNITY): Payer: Self-pay

## 2021-08-14 MED ORDER — HUMIRA PEN 40 MG/0.8ML ~~LOC~~ PNKT
PEN_INJECTOR | SUBCUTANEOUS | 3 refills | Status: DC
  Filled 2021-08-14: qty 2, 30d supply, fill #0

## 2021-08-14 MED ORDER — HUMIRA PEN 40 MG/0.8ML ~~LOC~~ PNKT
PEN_INJECTOR | SUBCUTANEOUS | 3 refills | Status: DC
Start: 2021-08-14 — End: 2022-01-30
  Filled 2021-08-14: qty 2, 30d supply, fill #0
  Filled 2021-09-22: qty 2, 30d supply, fill #1
  Filled 2021-10-30: qty 2, 28d supply, fill #2
  Filled 2022-01-03 (×2): qty 2, 28d supply, fill #3

## 2021-08-16 ENCOUNTER — Other Ambulatory Visit (HOSPITAL_COMMUNITY): Payer: Self-pay

## 2021-08-18 DIAGNOSIS — Z79899 Other long term (current) drug therapy: Secondary | ICD-10-CM | POA: Diagnosis not present

## 2021-08-18 DIAGNOSIS — M542 Cervicalgia: Secondary | ICD-10-CM | POA: Diagnosis not present

## 2021-08-18 DIAGNOSIS — M79602 Pain in left arm: Secondary | ICD-10-CM | POA: Diagnosis not present

## 2021-08-18 DIAGNOSIS — H2013 Chronic iridocyclitis, bilateral: Secondary | ICD-10-CM | POA: Diagnosis not present

## 2021-08-18 DIAGNOSIS — M450 Ankylosing spondylitis of multiple sites in spine: Secondary | ICD-10-CM | POA: Diagnosis not present

## 2021-08-18 DIAGNOSIS — Z6823 Body mass index (BMI) 23.0-23.9, adult: Secondary | ICD-10-CM | POA: Diagnosis not present

## 2021-08-28 ENCOUNTER — Other Ambulatory Visit (HOSPITAL_COMMUNITY): Payer: Self-pay

## 2021-09-18 ENCOUNTER — Other Ambulatory Visit (HOSPITAL_COMMUNITY): Payer: Self-pay

## 2021-09-22 ENCOUNTER — Other Ambulatory Visit (HOSPITAL_COMMUNITY): Payer: Self-pay

## 2021-09-26 ENCOUNTER — Other Ambulatory Visit (HOSPITAL_COMMUNITY): Payer: Self-pay

## 2021-10-04 ENCOUNTER — Other Ambulatory Visit (HOSPITAL_COMMUNITY): Payer: Self-pay

## 2021-10-26 ENCOUNTER — Other Ambulatory Visit (HOSPITAL_COMMUNITY): Payer: Self-pay

## 2021-10-27 ENCOUNTER — Other Ambulatory Visit (HOSPITAL_COMMUNITY): Payer: Self-pay

## 2021-10-27 MED ORDER — TADALAFIL 5 MG PO TABS
5.0000 mg | ORAL_TABLET | Freq: Every day | ORAL | 5 refills | Status: DC
  Filled 2021-10-27: qty 30, 30d supply, fill #0
  Filled 2021-11-29: qty 30, 30d supply, fill #1
  Filled 2021-12-28: qty 30, 30d supply, fill #2
  Filled 2022-01-23: qty 30, 30d supply, fill #3
  Filled 2022-02-21: qty 30, 30d supply, fill #4
  Filled 2022-03-23: qty 30, 30d supply, fill #5

## 2021-10-30 ENCOUNTER — Other Ambulatory Visit (HOSPITAL_COMMUNITY): Payer: Self-pay

## 2021-11-03 ENCOUNTER — Other Ambulatory Visit (HOSPITAL_COMMUNITY): Payer: Self-pay

## 2021-11-09 ENCOUNTER — Other Ambulatory Visit (HOSPITAL_COMMUNITY): Payer: Self-pay

## 2021-11-10 ENCOUNTER — Other Ambulatory Visit (HOSPITAL_COMMUNITY): Payer: Self-pay

## 2021-11-13 ENCOUNTER — Other Ambulatory Visit (HOSPITAL_COMMUNITY): Payer: Self-pay

## 2021-11-21 DIAGNOSIS — E782 Mixed hyperlipidemia: Secondary | ICD-10-CM | POA: Diagnosis not present

## 2021-11-21 DIAGNOSIS — I10 Essential (primary) hypertension: Secondary | ICD-10-CM | POA: Diagnosis not present

## 2021-11-21 DIAGNOSIS — M130 Polyarthritis, unspecified: Secondary | ICD-10-CM | POA: Diagnosis not present

## 2021-11-29 ENCOUNTER — Other Ambulatory Visit (HOSPITAL_COMMUNITY): Payer: Self-pay

## 2021-11-30 ENCOUNTER — Other Ambulatory Visit (HOSPITAL_COMMUNITY): Payer: Self-pay

## 2021-12-04 ENCOUNTER — Other Ambulatory Visit (HOSPITAL_COMMUNITY): Payer: Self-pay

## 2021-12-04 DIAGNOSIS — Z1211 Encounter for screening for malignant neoplasm of colon: Secondary | ICD-10-CM | POA: Diagnosis not present

## 2021-12-04 DIAGNOSIS — Z1212 Encounter for screening for malignant neoplasm of rectum: Secondary | ICD-10-CM | POA: Diagnosis not present

## 2021-12-06 ENCOUNTER — Other Ambulatory Visit (HOSPITAL_COMMUNITY): Payer: Self-pay

## 2021-12-08 ENCOUNTER — Other Ambulatory Visit (HOSPITAL_COMMUNITY): Payer: Self-pay

## 2021-12-14 ENCOUNTER — Other Ambulatory Visit (HOSPITAL_COMMUNITY): Payer: Self-pay

## 2021-12-15 ENCOUNTER — Other Ambulatory Visit (HOSPITAL_COMMUNITY): Payer: Self-pay

## 2021-12-15 MED ORDER — NEBIVOLOL HCL 20 MG PO TABS
20.0000 mg | ORAL_TABLET | Freq: Every day | ORAL | 1 refills | Status: DC
  Filled 2021-12-15: qty 90, 90d supply, fill #0
  Filled 2022-04-26 – 2022-05-16 (×2): qty 90, 90d supply, fill #1

## 2021-12-25 ENCOUNTER — Other Ambulatory Visit (HOSPITAL_COMMUNITY): Payer: Self-pay

## 2021-12-28 ENCOUNTER — Other Ambulatory Visit (HOSPITAL_COMMUNITY): Payer: Self-pay

## 2021-12-29 ENCOUNTER — Other Ambulatory Visit (HOSPITAL_COMMUNITY): Payer: Self-pay

## 2022-01-03 ENCOUNTER — Other Ambulatory Visit (HOSPITAL_COMMUNITY): Payer: Self-pay

## 2022-01-23 ENCOUNTER — Other Ambulatory Visit (HOSPITAL_COMMUNITY): Payer: Self-pay

## 2022-01-26 ENCOUNTER — Other Ambulatory Visit (HOSPITAL_COMMUNITY): Payer: Self-pay

## 2022-01-30 ENCOUNTER — Other Ambulatory Visit (HOSPITAL_COMMUNITY): Payer: Self-pay

## 2022-01-30 ENCOUNTER — Other Ambulatory Visit: Payer: Self-pay | Admitting: Pharmacist

## 2022-01-30 ENCOUNTER — Other Ambulatory Visit: Payer: Self-pay

## 2022-01-30 MED ORDER — HUMIRA PEN 40 MG/0.8ML ~~LOC~~ PNKT: PEN_INJECTOR | SUBCUTANEOUS | 0 refills | Status: DC

## 2022-01-30 MED ORDER — HUMIRA PEN 40 MG/0.8ML ~~LOC~~ PNKT
PEN_INJECTOR | SUBCUTANEOUS | 0 refills | Status: DC
  Filled 2022-01-30: qty 2, 28d supply, fill #0

## 2022-01-31 ENCOUNTER — Other Ambulatory Visit: Payer: Self-pay

## 2022-02-13 ENCOUNTER — Other Ambulatory Visit (HOSPITAL_COMMUNITY): Payer: Self-pay

## 2022-02-16 DIAGNOSIS — R5383 Other fatigue: Secondary | ICD-10-CM | POA: Diagnosis not present

## 2022-02-16 DIAGNOSIS — Z111 Encounter for screening for respiratory tuberculosis: Secondary | ICD-10-CM | POA: Diagnosis not present

## 2022-02-16 DIAGNOSIS — H2013 Chronic iridocyclitis, bilateral: Secondary | ICD-10-CM | POA: Diagnosis not present

## 2022-02-16 DIAGNOSIS — M79602 Pain in left arm: Secondary | ICD-10-CM | POA: Diagnosis not present

## 2022-02-16 DIAGNOSIS — Z79899 Other long term (current) drug therapy: Secondary | ICD-10-CM | POA: Diagnosis not present

## 2022-02-16 DIAGNOSIS — M450 Ankylosing spondylitis of multiple sites in spine: Secondary | ICD-10-CM | POA: Diagnosis not present

## 2022-02-16 DIAGNOSIS — M25511 Pain in right shoulder: Secondary | ICD-10-CM | POA: Diagnosis not present

## 2022-02-16 DIAGNOSIS — M542 Cervicalgia: Secondary | ICD-10-CM | POA: Diagnosis not present

## 2022-02-16 DIAGNOSIS — Z6823 Body mass index (BMI) 23.0-23.9, adult: Secondary | ICD-10-CM | POA: Diagnosis not present

## 2022-02-20 ENCOUNTER — Other Ambulatory Visit (HOSPITAL_COMMUNITY): Payer: Self-pay

## 2022-02-21 ENCOUNTER — Other Ambulatory Visit (HOSPITAL_COMMUNITY): Payer: Self-pay

## 2022-02-22 ENCOUNTER — Other Ambulatory Visit (HOSPITAL_COMMUNITY): Payer: Self-pay

## 2022-02-23 ENCOUNTER — Other Ambulatory Visit: Payer: Self-pay

## 2022-02-24 ENCOUNTER — Ambulatory Visit (HOSPITAL_COMMUNITY)
Admission: EM | Admit: 2022-02-24 | Discharge: 2022-02-24 | Disposition: A | Discharge status: Home / Self Care | Payer: Commercial Managed Care - PPO | Attending: Emergency Medicine | Admitting: Emergency Medicine

## 2022-02-24 ENCOUNTER — Other Ambulatory Visit: Payer: Self-pay

## 2022-02-24 ENCOUNTER — Encounter (HOSPITAL_COMMUNITY): Payer: Self-pay | Admitting: Emergency Medicine

## 2022-02-24 DIAGNOSIS — H209 Unspecified iridocyclitis: Secondary | ICD-10-CM | POA: Diagnosis not present

## 2022-02-24 MED ORDER — PREDNISOLONE ACETATE 1 % OP SUSP: 1.0000 [drp] | Freq: Four times a day (QID) | OPHTHALMIC | 0 refills | Status: AC

## 2022-02-24 NOTE — ED Provider Notes (Signed)
[facility]    CSN: [id number] Arrival date & time: 02/24/22  1421      History   Chief Complaint Chief Complaint  Patient presents with   Eye Problem    HPI Cameron Brown is a 57 y.o. male.  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  He reports that he has worse symptoms in his left eye.  He reports photophobia.  He reports having intermittent blurred vision at times.  He reports that he has had to take his contact out of his left eye.  He reports that this flareup is his usual baseline and not worse than past flare-ups.  He reports that this iritis has been ongoing for "years". He denies any trauma or fall upon onset of symptoms.  He reports that he does have an ophthalmologist that he can follow-up with, he reports that he has not seen the ophthalmologist in the past year.   He reports that he also follows up with rheumatology.   Eye Problem Associated symptoms: photophobia and redness   Associated symptoms: no discharge and no itching     Past Medical History:  Diagnosis Date   Ankylosing spondylitis ([place])    Arthritis    Hypertension     Patient Active Problem List   Diagnosis Date Noted   ED (erectile dysfunction) 12/04/2012   Ankylosing spondylitis ([place]) 01/30/1996   Iritis 01/29/1993    Past Surgical History:  Procedure Laterality Date   ANTERIOR CRUCIATE LIGAMENT REPAIR     ROTATOR CUFF REPAIR         Home Medications    Prior to Admission medications   Medication Sig Start Date End Date Taking? Authorizing Provider  prednisoLONE acetate (PRED FORTE) 1 % ophthalmic suspension Place 1 drop into both eyes 4 (four) times daily. 02/24/22  Yes Flossie Dibble, NP  Adalimumab (HUMIRA PEN) 40 MG/0.8ML PNKT 40 mg Subcutaneous every 2 weeks 30 days 01/30/22   Tresa Garter, MD  cetirizine-pseudoephedrine (ZYRTEC-D) 5-120 MG tablet Take 1 tablet by mouth daily. 11/05/17   Wurst, [place],  PA-C  cyclobenzaprine (FLEXERIL) 5 MG tablet Take one tablet by mouth at bedtime. 08/17/20     cyclobenzaprine (FLEXERIL) 5 MG tablet Take 1 tablet by mouth nightly as needed 02/17/21     Glucosamine-Chondroit-Vit C-Mn (GLUCOSAMINE CHONDR 1500 COMPLX PO) Take 1 tablet by mouth daily.    [provider]  naproxen sodium (ANAPROX) 220 MG tablet Take 220 mg by mouth daily as needed. For pain    [provider]  Nebivolol HCl 20 MG TABS Take 1 tablet (20 mg total) by mouth daily. 12/15/21     pantoprazole (PROTONIX) 40 MG tablet TAKE ONE TABLET BY MOUTH DAILY 03/23/20 03/23/21  Lucianne Lei, MD  pantoprazole (PROTONIX) 40 MG tablet Take 1 tablet (40 mg total) by mouth daily. 07/25/20     tadalafil (CIALIS) 5 MG tablet Take 1 tablet (5 mg total) by mouth daily. 12/04/12   Gregor Hams, MD  tadalafil (CIALIS) 5 MG tablet Take 1 tablet (5 mg total) by mouth daily. 10/27/21     valACYclovir (VALTREX) 500 MG tablet Take 1 tablet by mouth twice daily. 01/20/21     Vitamin D, Ergocalciferol, (DRISDOL) 1.25 MG (50000 UNIT) CAPS capsule Take 1 capsule by mouth each week. 01/20/21     Vitamin D, Ergocalciferol, (DRISDOL) 1.25 MG (50000 UNIT) CAPS capsule Take 1 capsule (50,000 Units total) by mouth once a week  on Sunday. 07/01/21       Family History History reviewed. No pertinent family history.  Social History Social History   Tobacco Use   Smoking status: Former    Packs/day: 0.50    Years: 10.00    Total pack years: 5.00    Types: Cigarettes    Start date: 01/30/1991    Quit date: 01/29/2001    Years since quitting: 21.0   Smokeless tobacco: Never   Tobacco comments:    Cigars prn boredom  Vaping Use   Vaping Use: Never used  Substance Use Topics   Alcohol use: Yes    Comment: rarely   Drug use: No     Allergies   Patient has no known allergies.   Review of Systems Review of Systems  Constitutional:  Negative for activity change, appetite change, chills, fatigue and fever.   HENT: Negative.    Eyes:  Positive for photophobia, pain, redness and visual disturbance. Negative for discharge and itching.     Physical Exam Triage Vital Signs ED Triage Vitals  Enc Vitals Group     BP 02/24/22 1459 137/87     Pulse Rate 02/24/22 1459 60     Resp 02/24/22 1459 18     Temp 02/24/22 1459 97.8 F (36.6 C)     Temp Source 02/24/22 1459 Oral     SpO2 02/24/22 1459 96 %     Weight --      Height --      Head Circumference --      Peak Flow --      Pain Score 02/24/22 1456 2     Pain Loc --      Pain Edu? --      Excl. in [place]? --    No data found.  Updated Vital Signs BP 137/87 (BP Location: Right Arm)   Pulse 60   Temp 97.8 F (36.6 C) (Oral)   Resp 18   SpO2 96%   Physical Exam Vitals and nursing note reviewed.  Constitutional:      Appearance: Normal appearance.  Eyes:     General: Lids are normal.        Right eye: No foreign body, discharge or hordeolum.        Left eye: No foreign body, discharge or hordeolum.     Extraocular Movements:     Right eye: Normal extraocular motion and no nystagmus.     Left eye: Normal extraocular motion and no nystagmus.     Conjunctiva/sclera:     Right eye: Right conjunctiva is injected. No chemosis, exudate or hemorrhage.    Left eye: Left conjunctiva is injected. Chemosis present. No exudate or hemorrhage.    Comments: No pain upon palpation bilaterally.   Neurological:     Mental Status: He is alert.      UC Treatments / Results  Labs (all labs ordered are listed, but only abnormal results are displayed) Labs Reviewed - No data to display  EKG   Radiology No results found.  Procedures Procedures (including critical care time)  Medications Ordered in UC Medications - No data to display  Initial Impression / Assessment and Plan / UC Course  I have reviewed the triage vital signs and the nursing notes.  Pertinent labs & imaging results that were available during my care of the patient were  reviewed by me and considered in my medical decision making (see chart for details).     Patient was evaluated for  iritis of both eyes.  This Clinical research associate contacted on-call ophthalmologist Brooke Dare MD for recommendations, Dr. Brooke Dare recommended prednisolone acetate eyedrops and schedule follow-up with the patient's ophthalmologist.  Prednisone acetate prescription was sent to the pharmacy.  Patient was educated on medication regiment.  Patient was made aware that he will need to schedule follow-up with his ophthalmologist.  Patient verbalized understanding of instructions.  Charting was provided using a a verbal dictation system, charting was proofread for errors, errors may occur which could change the meaning of the information charted.   Final Clinical Impressions(s) / UC Diagnoses   Final diagnoses:  Iritis of both eyes     Discharge Instructions      Prednisolone acetate eyedrops have been sent to the pharmacy per ophthalmologist recommendation, you will place 4 drops in each eye 4 times daily.   Please follow-up with your ophthalmologist on Monday preferably.       ED Prescriptions     Medication Sig Dispense Auth. Provider   prednisoLONE acetate (PRED FORTE) 1 % ophthalmic suspension Place 1 drop into both eyes 4 (four) times daily. 5 mL Debby Freiberg, NP      PDMP not reviewed this encounter.   Debby Freiberg, NP 02/24/22 1545

## 2022-02-24 NOTE — Discharge Instructions (Addendum)
Prednisolone acetate eyedrops have been sent to the pharmacy per ophthalmologist recommendation, you will place 4 drops in each eye 4 times daily.   Please follow-up with your ophthalmologist on Monday preferably.

## 2022-02-24 NOTE — ED Triage Notes (Addendum)
Patient states iritis is acting up.  States this started last night.  Left eye is red.  Slight blurry vision  Patient wears contacts.  Right contact is in currently.  Left contact is out

## 2022-02-27 DIAGNOSIS — E782 Mixed hyperlipidemia: Secondary | ICD-10-CM | POA: Diagnosis not present

## 2022-02-27 DIAGNOSIS — H20019 Primary iridocyclitis, unspecified eye: Secondary | ICD-10-CM | POA: Diagnosis not present

## 2022-02-27 DIAGNOSIS — M450 Ankylosing spondylitis of multiple sites in spine: Secondary | ICD-10-CM | POA: Diagnosis not present

## 2022-03-01 ENCOUNTER — Other Ambulatory Visit (HOSPITAL_COMMUNITY): Payer: Self-pay

## 2022-03-02 ENCOUNTER — Other Ambulatory Visit (HOSPITAL_COMMUNITY): Payer: Self-pay

## 2022-03-06 ENCOUNTER — Other Ambulatory Visit (HOSPITAL_COMMUNITY): Payer: Self-pay

## 2022-03-07 ENCOUNTER — Other Ambulatory Visit (HOSPITAL_COMMUNITY): Payer: Self-pay

## 2022-03-07 MED ORDER — HYDROCODONE-ACETAMINOPHEN 5-325 MG PO TABS
1.0000 | ORAL_TABLET | ORAL | 0 refills | Status: AC | PRN
  Filled 2022-03-07: qty 4, 1d supply, fill #0

## 2022-03-07 MED ORDER — AMOXICILLIN 500 MG PO CAPS
500.0000 mg | ORAL_CAPSULE | Freq: Three times a day (TID) | ORAL | 0 refills | Status: AC
  Filled 2022-03-07: qty 21, 7d supply, fill #0

## 2022-03-07 MED ORDER — CHLORHEXIDINE GLUCONATE 0.12 % MT SOLN
OROMUCOSAL | 0 refills | Status: AC
  Filled 2022-03-07: qty 473, 16d supply, fill #0

## 2022-03-07 MED ORDER — DEXAMETHASONE 4 MG PO TABS
4.0000 mg | ORAL_TABLET | Freq: Three times a day (TID) | ORAL | 0 refills | Status: AC | PRN
  Filled 2022-03-07: qty 9, 3d supply, fill #0

## 2022-03-07 MED ORDER — IBUPROFEN 400 MG PO TABS
400.0000 mg | ORAL_TABLET | ORAL | 0 refills | Status: AC
  Filled 2022-03-07: qty 30, 5d supply, fill #0

## 2022-03-08 ENCOUNTER — Other Ambulatory Visit (HOSPITAL_COMMUNITY): Payer: Self-pay

## 2022-03-09 ENCOUNTER — Other Ambulatory Visit (HOSPITAL_COMMUNITY): Payer: Self-pay

## 2022-03-15 ENCOUNTER — Other Ambulatory Visit (HOSPITAL_COMMUNITY): Payer: Self-pay

## 2022-03-19 ENCOUNTER — Other Ambulatory Visit (HOSPITAL_COMMUNITY): Payer: Self-pay

## 2022-03-20 ENCOUNTER — Other Ambulatory Visit (HOSPITAL_COMMUNITY): Payer: Self-pay

## 2022-03-21 ENCOUNTER — Ambulatory Visit: Payer: Commercial Managed Care - PPO | Attending: Internal Medicine | Admitting: Pharmacist

## 2022-03-21 DIAGNOSIS — Z79899 Other long term (current) drug therapy: Secondary | ICD-10-CM

## 2022-03-21 NOTE — Progress Notes (Signed)
   S: Patient presents today to the [facility] for review of his specialty medication.  Patient is currently taking Humira for ankylosing spondylitis. Patient is managed by Dr. Amil Amen for this.  Adherence: denies any missed doses.  Efficacy: reports that it is still working really well for him.  Dosing:  Ankylosing spondylitis: SubQ: 40 mg every other week (may continue methotrexate, other nonbiologic DMARDS, corticosteroids, NSAIDs and/or analgesics)  Screening: TB test: completed per patient Hepatitis: completed per patient  Monitoring: S/sx of infection: denies CBC done by Dr. Amil Amen and has been WNL S/sx of hypersensitivity: denies S/sx of malignancy: denies S/sx of heart failure: denies  O:  No results found for: "WBC", "HGB", "HCT", "MCV", "PLT"    Chemistry   No results found for: "NA", "K", "CL", "CO2", "BUN", "CREATININE", "GLU" No results found for: "CALCIUM", "ALKPHOS", "AST", "ALT", "BILITOT"     A/P: 1. Medication review: Patient on Humira for ankylosing spondylitis and is tolerating it well with no adverse effects and continued control of disease state. Reviewed the medication with him, including the following: Humira is a TNF blocking agent indicated for ankylosing spondylitis, Crohn's disease, Hidradenitis suppurativa, psoriatic arthritis, plaque psoriasis, ulcerative colitis, and uveitis. The most common adverse effects are infections, headache, and injection site reactions. There is the possibility of an increased risk of malignancy but it is not well understood if this increased risk is due to there medication or the disease state. There are rare cases of pancytopenia and aplastic anemia. No recommendations for changes.   Benard Halsted, PharmD, [place], [employer] [phone]

## 2022-03-23 ENCOUNTER — Other Ambulatory Visit (HOSPITAL_COMMUNITY): Payer: Self-pay

## 2022-03-29 ENCOUNTER — Other Ambulatory Visit (HOSPITAL_COMMUNITY): Payer: Self-pay

## 2022-04-04 ENCOUNTER — Other Ambulatory Visit (HOSPITAL_COMMUNITY): Payer: Self-pay

## 2022-04-06 ENCOUNTER — Other Ambulatory Visit (HOSPITAL_COMMUNITY): Payer: Self-pay

## 2022-04-09 ENCOUNTER — Other Ambulatory Visit (HOSPITAL_COMMUNITY): Payer: Self-pay

## 2022-04-09 ENCOUNTER — Other Ambulatory Visit: Payer: Self-pay | Admitting: Pharmacist

## 2022-04-09 MED ORDER — HUMIRA (2 PEN) 40 MG/0.4ML ~~LOC~~ AJKT
AUTO-INJECTOR | SUBCUTANEOUS | 5 refills | Status: DC
  Filled 2022-04-09: qty 2, 30d supply, fill #0

## 2022-04-09 MED ORDER — HUMIRA (2 PEN) 40 MG/0.4ML ~~LOC~~ AJKT: AUTO-INJECTOR | SUBCUTANEOUS | 5 refills | Status: DC

## 2022-04-10 ENCOUNTER — Other Ambulatory Visit (HOSPITAL_COMMUNITY): Payer: Self-pay

## 2022-04-11 ENCOUNTER — Other Ambulatory Visit (HOSPITAL_COMMUNITY): Payer: Self-pay

## 2022-04-12 ENCOUNTER — Other Ambulatory Visit (HOSPITAL_COMMUNITY): Payer: Self-pay

## 2022-04-12 ENCOUNTER — Other Ambulatory Visit: Payer: Self-pay

## 2022-04-12 ENCOUNTER — Other Ambulatory Visit: Payer: Self-pay | Admitting: Pharmacist

## 2022-04-12 MED ORDER — HUMIRA (2 PEN) 80 MG/0.8ML ~~LOC~~ PNKT
0.8000 mL | PEN_INJECTOR | SUBCUTANEOUS | 5 refills | Status: DC
  Filled 2022-04-12: qty 2, fill #0

## 2022-04-12 MED ORDER — HUMIRA (2 PEN) 80 MG/0.8ML ~~LOC~~ PNKT: 0.8000 mL | PEN_INJECTOR | SUBCUTANEOUS | 5 refills | Status: DC

## 2022-04-13 ENCOUNTER — Other Ambulatory Visit: Payer: Self-pay | Admitting: Pharmacist

## 2022-04-13 ENCOUNTER — Other Ambulatory Visit: Payer: Self-pay

## 2022-04-13 ENCOUNTER — Other Ambulatory Visit (HOSPITAL_COMMUNITY): Payer: Self-pay

## 2022-04-13 MED ORDER — HUMIRA (2 PEN) 40 MG/0.8ML ~~LOC~~ AJKT
AUTO-INJECTOR | SUBCUTANEOUS | 3 refills | Status: DC
  Filled 2022-04-13: qty 2, 28d supply, fill #0
  Filled 2022-05-04: qty 2, 28d supply, fill #1
  Filled 2022-06-05: qty 2, 28d supply, fill #2
  Filled 2022-07-06: qty 2, 28d supply, fill #3

## 2022-04-13 MED ORDER — TADALAFIL 5 MG PO TABS
5.0000 mg | ORAL_TABLET | Freq: Every day | ORAL | 5 refills | Status: DC
  Filled 2022-04-13: qty 30, 30d supply, fill #0
  Filled 2022-05-16: qty 30, 30d supply, fill #1
  Filled 2022-06-15: qty 30, 30d supply, fill #2
  Filled 2022-07-13: qty 30, 30d supply, fill #3
  Filled 2022-08-17: qty 30, 30d supply, fill #4
  Filled 2022-09-12: qty 30, 30d supply, fill #5

## 2022-04-13 MED ORDER — HUMIRA (2 PEN) 40 MG/0.8ML ~~LOC~~ AJKT: AUTO-INJECTOR | SUBCUTANEOUS | 3 refills | Status: DC

## 2022-04-14 ENCOUNTER — Other Ambulatory Visit (HOSPITAL_COMMUNITY): Payer: Self-pay

## 2022-04-16 ENCOUNTER — Other Ambulatory Visit (HOSPITAL_COMMUNITY): Payer: Self-pay

## 2022-04-26 ENCOUNTER — Other Ambulatory Visit (HOSPITAL_COMMUNITY): Payer: Self-pay

## 2022-05-01 ENCOUNTER — Other Ambulatory Visit (HOSPITAL_COMMUNITY): Payer: Self-pay

## 2022-05-02 ENCOUNTER — Other Ambulatory Visit (HOSPITAL_COMMUNITY): Payer: Self-pay

## 2022-05-04 ENCOUNTER — Other Ambulatory Visit (HOSPITAL_COMMUNITY): Payer: Self-pay

## 2022-05-08 ENCOUNTER — Other Ambulatory Visit (HOSPITAL_COMMUNITY): Payer: Self-pay

## 2022-05-16 ENCOUNTER — Other Ambulatory Visit (HOSPITAL_COMMUNITY): Payer: Self-pay

## 2022-05-30 DIAGNOSIS — H40013 Open angle with borderline findings, low risk, bilateral: Secondary | ICD-10-CM | POA: Diagnosis not present

## 2022-05-30 DIAGNOSIS — H21233 Degeneration of iris (pigmentary), bilateral: Secondary | ICD-10-CM | POA: Diagnosis not present

## 2022-06-05 ENCOUNTER — Other Ambulatory Visit (HOSPITAL_COMMUNITY): Payer: Self-pay

## 2022-06-08 ENCOUNTER — Other Ambulatory Visit: Payer: Self-pay

## 2022-06-15 ENCOUNTER — Other Ambulatory Visit (HOSPITAL_COMMUNITY): Payer: Self-pay

## 2022-06-28 DIAGNOSIS — N5201 Erectile dysfunction due to arterial insufficiency: Secondary | ICD-10-CM | POA: Diagnosis not present

## 2022-06-28 DIAGNOSIS — E559 Vitamin D deficiency, unspecified: Secondary | ICD-10-CM | POA: Diagnosis not present

## 2022-06-28 DIAGNOSIS — M450 Ankylosing spondylitis of multiple sites in spine: Secondary | ICD-10-CM | POA: Diagnosis not present

## 2022-06-28 DIAGNOSIS — E782 Mixed hyperlipidemia: Secondary | ICD-10-CM | POA: Diagnosis not present

## 2022-06-28 DIAGNOSIS — I10 Essential (primary) hypertension: Secondary | ICD-10-CM | POA: Diagnosis not present

## 2022-07-05 ENCOUNTER — Other Ambulatory Visit: Payer: Self-pay

## 2022-07-06 ENCOUNTER — Other Ambulatory Visit: Payer: Self-pay

## 2022-07-06 ENCOUNTER — Other Ambulatory Visit (HOSPITAL_COMMUNITY): Payer: Self-pay

## 2022-07-13 ENCOUNTER — Other Ambulatory Visit (HOSPITAL_COMMUNITY): Payer: Self-pay

## 2022-07-16 ENCOUNTER — Other Ambulatory Visit (HOSPITAL_COMMUNITY): Payer: Self-pay

## 2022-07-19 ENCOUNTER — Other Ambulatory Visit (HOSPITAL_COMMUNITY): Payer: Self-pay

## 2022-08-14 ENCOUNTER — Other Ambulatory Visit: Payer: Self-pay | Admitting: Pharmacist

## 2022-08-14 ENCOUNTER — Other Ambulatory Visit: Payer: Self-pay

## 2022-08-14 ENCOUNTER — Other Ambulatory Visit (HOSPITAL_COMMUNITY): Payer: Self-pay

## 2022-08-14 MED ORDER — HUMIRA (2 PEN) 40 MG/0.8ML ~~LOC~~ AJKT
AUTO-INJECTOR | SUBCUTANEOUS | 3 refills | Status: DC
  Filled 2022-08-14: qty 2, 28d supply, fill #0
  Filled 2022-09-19: qty 2, 28d supply, fill #1
  Filled 2022-10-16: qty 2, 28d supply, fill #2
  Filled 2022-11-21: qty 2, 28d supply, fill #3

## 2022-08-14 MED ORDER — HUMIRA (2 PEN) 40 MG/0.8ML ~~LOC~~ AJKT: AUTO-INJECTOR | SUBCUTANEOUS | 3 refills | Status: DC

## 2022-08-15 ENCOUNTER — Other Ambulatory Visit (HOSPITAL_COMMUNITY): Payer: Self-pay

## 2022-08-17 ENCOUNTER — Other Ambulatory Visit (HOSPITAL_COMMUNITY): Payer: Self-pay

## 2022-08-17 ENCOUNTER — Other Ambulatory Visit: Payer: Self-pay

## 2022-08-17 DIAGNOSIS — M450 Ankylosing spondylitis of multiple sites in spine: Secondary | ICD-10-CM | POA: Diagnosis not present

## 2022-08-17 DIAGNOSIS — H2013 Chronic iridocyclitis, bilateral: Secondary | ICD-10-CM | POA: Diagnosis not present

## 2022-08-17 DIAGNOSIS — M25511 Pain in right shoulder: Secondary | ICD-10-CM | POA: Diagnosis not present

## 2022-08-17 DIAGNOSIS — M542 Cervicalgia: Secondary | ICD-10-CM | POA: Diagnosis not present

## 2022-08-17 DIAGNOSIS — Z6823 Body mass index (BMI) 23.0-23.9, adult: Secondary | ICD-10-CM | POA: Diagnosis not present

## 2022-08-17 DIAGNOSIS — M79602 Pain in left arm: Secondary | ICD-10-CM | POA: Diagnosis not present

## 2022-08-17 DIAGNOSIS — Z79899 Other long term (current) drug therapy: Secondary | ICD-10-CM | POA: Diagnosis not present

## 2022-08-17 MED ORDER — CYCLOBENZAPRINE HCL 5 MG PO TABS
5.0000 mg | ORAL_TABLET | Freq: Every evening | ORAL | 3 refills | Status: AC
  Filled 2022-08-17: qty 90, 90d supply, fill #0

## 2022-08-17 MED ORDER — PREDNISONE 5 MG (48) PO TBPK
ORAL_TABLET | ORAL | 0 refills | Status: AC
  Filled 2022-08-17: qty 48, 12d supply, fill #0

## 2022-08-18 ENCOUNTER — Other Ambulatory Visit (HOSPITAL_COMMUNITY): Payer: Self-pay

## 2022-08-21 ENCOUNTER — Other Ambulatory Visit (HOSPITAL_COMMUNITY): Payer: Self-pay

## 2022-09-12 ENCOUNTER — Other Ambulatory Visit (HOSPITAL_COMMUNITY): Payer: Self-pay

## 2022-09-15 MED ORDER — NEBIVOLOL HCL 20 MG PO TABS
20.0000 mg | ORAL_TABLET | Freq: Every day | ORAL | 1 refills | Status: AC
  Filled 2022-09-15 – 2022-09-27 (×2): qty 90, 90d supply, fill #0
  Filled 2022-12-14: qty 90, 90d supply, fill #1

## 2022-09-17 ENCOUNTER — Other Ambulatory Visit (HOSPITAL_COMMUNITY): Payer: Self-pay

## 2022-09-19 ENCOUNTER — Other Ambulatory Visit (HOSPITAL_COMMUNITY): Payer: Self-pay

## 2022-09-24 ENCOUNTER — Other Ambulatory Visit (HOSPITAL_COMMUNITY): Payer: Self-pay

## 2022-09-26 ENCOUNTER — Other Ambulatory Visit (HOSPITAL_COMMUNITY): Payer: Self-pay

## 2022-09-27 ENCOUNTER — Other Ambulatory Visit (HOSPITAL_COMMUNITY): Payer: Self-pay

## 2022-10-12 ENCOUNTER — Other Ambulatory Visit (HOSPITAL_COMMUNITY): Payer: Self-pay

## 2022-10-13 ENCOUNTER — Other Ambulatory Visit (HOSPITAL_COMMUNITY): Payer: Self-pay

## 2022-10-13 MED ORDER — TADALAFIL 5 MG PO TABS
5.0000 mg | ORAL_TABLET | Freq: Every day | ORAL | 5 refills | Status: DC
  Filled 2022-10-13: qty 30, 30d supply, fill #0
  Filled 2022-11-14 – 2022-11-15 (×2): qty 30, 30d supply, fill #1
  Filled 2022-12-14: qty 30, 30d supply, fill #2
  Filled 2023-01-08: qty 30, 30d supply, fill #3
  Filled 2023-02-11: qty 30, 30d supply, fill #4
  Filled 2023-03-07: qty 30, 30d supply, fill #5

## 2022-10-16 ENCOUNTER — Other Ambulatory Visit (HOSPITAL_COMMUNITY): Payer: Self-pay

## 2022-10-18 ENCOUNTER — Other Ambulatory Visit: Payer: Self-pay

## 2022-10-20 ENCOUNTER — Encounter (HOSPITAL_COMMUNITY): Payer: Self-pay

## 2022-10-22 ENCOUNTER — Other Ambulatory Visit: Payer: Self-pay

## 2022-10-29 DIAGNOSIS — M450 Ankylosing spondylitis of multiple sites in spine: Secondary | ICD-10-CM | POA: Diagnosis not present

## 2022-10-29 DIAGNOSIS — I10 Essential (primary) hypertension: Secondary | ICD-10-CM | POA: Diagnosis not present

## 2022-10-29 DIAGNOSIS — E782 Mixed hyperlipidemia: Secondary | ICD-10-CM | POA: Diagnosis not present

## 2022-10-29 DIAGNOSIS — M130 Polyarthritis, unspecified: Secondary | ICD-10-CM | POA: Diagnosis not present

## 2022-10-29 DIAGNOSIS — Z23 Encounter for immunization: Secondary | ICD-10-CM | POA: Diagnosis not present

## 2022-11-01 DIAGNOSIS — M79645 Pain in left finger(s): Secondary | ICD-10-CM | POA: Diagnosis not present

## 2022-11-14 ENCOUNTER — Other Ambulatory Visit (HOSPITAL_COMMUNITY): Payer: Self-pay

## 2022-11-15 ENCOUNTER — Other Ambulatory Visit (HOSPITAL_COMMUNITY): Payer: Self-pay

## 2022-11-16 ENCOUNTER — Other Ambulatory Visit (HOSPITAL_COMMUNITY): Payer: Self-pay

## 2022-11-19 ENCOUNTER — Other Ambulatory Visit: Payer: Self-pay

## 2022-11-21 ENCOUNTER — Other Ambulatory Visit: Payer: Self-pay

## 2022-11-21 NOTE — Progress Notes (Signed)
Specialty Pharmacy Refill Coordination Note  Cameron Brown is a 57 y.o. male contacted today regarding refills of specialty medication(s) Adalimumab   Patient requested Delivery   Delivery date: 11/27/22   Verified address: [address]   [facility] [place] [address]   Medication will be filled on 11/26/22.

## 2022-11-26 ENCOUNTER — Other Ambulatory Visit: Payer: Self-pay

## 2022-11-26 ENCOUNTER — Other Ambulatory Visit (HOSPITAL_BASED_OUTPATIENT_CLINIC_OR_DEPARTMENT_OTHER): Payer: Self-pay

## 2022-11-26 NOTE — Progress Notes (Signed)
Pharmacy Patient Advocate Encounter   Received notification from Patient Pharmacy that prior authorization for Humira is required/requested.   Insurance verification completed.   The patient is insured through [facility] .   Per test claim: PA required; PA submitted to [facility] via CoverMyMeds Key/confirmation #/EOC [facility] Status is pending

## 2022-11-27 ENCOUNTER — Other Ambulatory Visit: Payer: Self-pay

## 2022-11-28 ENCOUNTER — Other Ambulatory Visit: Payer: Self-pay

## 2022-11-28 NOTE — Progress Notes (Signed)
Pharmacy Patient Advocate Encounter  Received notification from [facility] that Prior Authorization for Humira has been APPROVED from 11/27/22 to 11/26/23   PA #/Case ID/Reference #: [employer]

## 2022-12-10 ENCOUNTER — Other Ambulatory Visit: Payer: Self-pay

## 2022-12-14 ENCOUNTER — Other Ambulatory Visit (HOSPITAL_COMMUNITY): Payer: Self-pay

## 2022-12-17 ENCOUNTER — Other Ambulatory Visit: Payer: Self-pay

## 2022-12-17 ENCOUNTER — Encounter (HOSPITAL_COMMUNITY): Payer: Self-pay

## 2022-12-17 ENCOUNTER — Other Ambulatory Visit: Payer: Self-pay | Admitting: Pharmacist

## 2022-12-17 ENCOUNTER — Other Ambulatory Visit (HOSPITAL_COMMUNITY): Payer: Self-pay

## 2022-12-17 MED ORDER — HUMIRA (2 PEN) 40 MG/0.8ML ~~LOC~~ AJKT
AUTO-INJECTOR | SUBCUTANEOUS | 3 refills | Status: DC
  Filled 2022-12-17: qty 2, 28d supply, fill #0
  Filled 2023-01-15: qty 2, 28d supply, fill #1
  Filled 2023-02-13: qty 2, 28d supply, fill #2
  Filled 2023-03-13: qty 2, 28d supply, fill #3

## 2022-12-17 MED ORDER — HUMIRA (2 PEN) 40 MG/0.8ML ~~LOC~~ AJKT
AUTO-INJECTOR | SUBCUTANEOUS | 3 refills | Status: DC
Start: 2022-12-17 — End: 2022-12-17

## 2022-12-17 NOTE — Progress Notes (Signed)
Specialty Pharmacy Refill Coordination Note  Cameron Brown is a 57 y.o. male contacted today regarding refills of specialty medication(s) Adalimumab   Patient requested Delivery   Delivery date: 12/21/22   Verified address: [address] [facility] [place] [address]   Medication will be filled on 12/20/22.  *Pending refill request*

## 2022-12-17 NOTE — Progress Notes (Signed)
Specialty Pharmacy Ongoing Clinical Assessment Note  Cameron Brown is a 57 y.o. male who is being followed by the specialty pharmacy service for RxSp Ankylosing Spondylitis   Patient's specialty medication(s) reviewed today: Adalimumab   Missed doses in the last 4 weeks: 0   Patient/Caregiver did not have any additional questions or concerns.   Therapeutic benefit summary: Patient is achieving benefit   Adverse events/side effects summary: No adverse events/side effects   Patient's therapy is appropriate to: Continue    Goals Addressed             This Visit's Progress    Minimize recurrence of flares       Patient is on track. Patient will maintain adherence.  [name] reports that he is well-controlled at this time, with no recent flares.          Follow up:  6 months  Servando Snare Specialty Pharmacist

## 2023-01-08 ENCOUNTER — Other Ambulatory Visit (HOSPITAL_COMMUNITY): Payer: Self-pay

## 2023-01-15 ENCOUNTER — Other Ambulatory Visit: Payer: Self-pay

## 2023-01-15 ENCOUNTER — Other Ambulatory Visit (HOSPITAL_COMMUNITY): Payer: Self-pay

## 2023-01-15 NOTE — Progress Notes (Signed)
Specialty Pharmacy Refill Coordination Note  Cameron Brown is a 57 y.o. male contacted today regarding refills of specialty medication(s) Adalimumab (Humira (2 Pen))   Patient requested Delivery   Delivery date: 01/18/23   Verified address: [address] [place] [address]   Medication will be filled on 12.19.24.

## 2023-02-11 ENCOUNTER — Other Ambulatory Visit (HOSPITAL_COMMUNITY): Payer: Self-pay

## 2023-02-12 ENCOUNTER — Other Ambulatory Visit (HOSPITAL_COMMUNITY): Payer: Self-pay

## 2023-02-13 ENCOUNTER — Other Ambulatory Visit: Payer: Self-pay

## 2023-02-13 NOTE — Progress Notes (Signed)
 Specialty Pharmacy Refill Coordination Note  Cameron Brown is a 58 y.o. male contacted today regarding refills of specialty medication(s) Adalimumab  (Humira  (2 Pen))   Patient requested Delivery   Delivery date: 02/19/23   Verified address: [address] [facility] [place] [address]   Medication will be filled on 02/18/23.

## 2023-02-15 ENCOUNTER — Other Ambulatory Visit: Payer: Self-pay

## 2023-02-18 ENCOUNTER — Other Ambulatory Visit: Payer: Self-pay

## 2023-02-22 ENCOUNTER — Other Ambulatory Visit (HOSPITAL_COMMUNITY): Payer: Self-pay

## 2023-02-22 DIAGNOSIS — Z111 Encounter for screening for respiratory tuberculosis: Secondary | ICD-10-CM | POA: Diagnosis not present

## 2023-02-22 DIAGNOSIS — Z79899 Other long term (current) drug therapy: Secondary | ICD-10-CM | POA: Diagnosis not present

## 2023-02-22 DIAGNOSIS — Z6824 Body mass index (BMI) 24.0-24.9, adult: Secondary | ICD-10-CM | POA: Diagnosis not present

## 2023-02-22 DIAGNOSIS — M542 Cervicalgia: Secondary | ICD-10-CM | POA: Diagnosis not present

## 2023-02-22 DIAGNOSIS — M450 Ankylosing spondylitis of multiple sites in spine: Secondary | ICD-10-CM | POA: Diagnosis not present

## 2023-02-22 DIAGNOSIS — M79602 Pain in left arm: Secondary | ICD-10-CM | POA: Diagnosis not present

## 2023-02-22 DIAGNOSIS — M25511 Pain in right shoulder: Secondary | ICD-10-CM | POA: Diagnosis not present

## 2023-02-22 DIAGNOSIS — H2013 Chronic iridocyclitis, bilateral: Secondary | ICD-10-CM | POA: Diagnosis not present

## 2023-02-22 MED ORDER — HYDROCODONE-ACETAMINOPHEN 5-325 MG PO TABS
1.0000 | ORAL_TABLET | Freq: Four times a day (QID) | ORAL | 0 refills | Status: AC | PRN
  Filled 2023-02-22: qty 4, 1d supply, fill #0

## 2023-02-25 ENCOUNTER — Other Ambulatory Visit (HOSPITAL_COMMUNITY): Payer: Self-pay

## 2023-02-26 ENCOUNTER — Other Ambulatory Visit: Payer: Self-pay

## 2023-02-26 ENCOUNTER — Other Ambulatory Visit (HOSPITAL_BASED_OUTPATIENT_CLINIC_OR_DEPARTMENT_OTHER): Payer: Self-pay

## 2023-02-27 ENCOUNTER — Other Ambulatory Visit: Payer: Self-pay

## 2023-02-28 DIAGNOSIS — E785 Hyperlipidemia, unspecified: Secondary | ICD-10-CM | POA: Diagnosis not present

## 2023-02-28 DIAGNOSIS — M130 Polyarthritis, unspecified: Secondary | ICD-10-CM | POA: Diagnosis not present

## 2023-02-28 DIAGNOSIS — I10 Essential (primary) hypertension: Secondary | ICD-10-CM | POA: Diagnosis not present

## 2023-03-01 ENCOUNTER — Other Ambulatory Visit: Payer: Self-pay

## 2023-03-07 ENCOUNTER — Other Ambulatory Visit (HOSPITAL_COMMUNITY): Payer: Self-pay

## 2023-03-08 ENCOUNTER — Other Ambulatory Visit (HOSPITAL_COMMUNITY): Payer: Self-pay

## 2023-03-13 ENCOUNTER — Other Ambulatory Visit: Payer: Self-pay

## 2023-03-13 NOTE — Progress Notes (Signed)
Specialty Pharmacy Refill Coordination Note  MAXAMUS COLAO is a 58 y.o. male contacted today regarding refills of specialty medication(s) Adalimumab (Humira (2 Pen))   Patient requested Delivery   Delivery date: 03/27/23   Verified address: [address]  [facility] [place] [address]   Medication will be filled on 03/26/23.

## 2023-03-27 ENCOUNTER — Telehealth: Payer: Self-pay | Admitting: Pharmacist

## 2023-03-27 NOTE — Telephone Encounter (Signed)
 Called patient to schedule an appointment for the [facility] [employer] Special[facility] Medication [facility]. I was unable to reach the patient so I left a HIPAA-compliant message requesting that the patient return my call.   Butch Penny, PharmD, [place], CPP Clinical Pharmacist [facility] [phone]

## 2023-04-04 ENCOUNTER — Other Ambulatory Visit (HOSPITAL_COMMUNITY): Payer: Self-pay

## 2023-04-04 MED ORDER — TADALAFIL 5 MG PO TABS
5.0000 mg | ORAL_TABLET | Freq: Every day | ORAL | 5 refills | Status: DC
  Filled 2023-04-04: qty 30, 30d supply, fill #0
  Filled 2023-05-06: qty 30, 30d supply, fill #1
  Filled 2023-06-03: qty 30, 30d supply, fill #2
  Filled 2023-07-01: qty 30, 30d supply, fill #3
  Filled 2023-08-02: qty 30, 30d supply, fill #4
  Filled 2023-09-09: qty 30, 30d supply, fill #5

## 2023-04-05 ENCOUNTER — Other Ambulatory Visit (HOSPITAL_COMMUNITY): Payer: Self-pay

## 2023-04-11 ENCOUNTER — Other Ambulatory Visit (HOSPITAL_COMMUNITY): Payer: Self-pay

## 2023-04-12 ENCOUNTER — Other Ambulatory Visit (HOSPITAL_COMMUNITY): Payer: Self-pay

## 2023-04-12 ENCOUNTER — Other Ambulatory Visit: Payer: Self-pay

## 2023-04-12 MED ORDER — NEBIVOLOL HCL 20 MG PO TABS
20.0000 mg | ORAL_TABLET | Freq: Every day | ORAL | 1 refills | Status: DC
  Filled 2023-04-12: qty 90, 90d supply, fill #0
  Filled 2023-07-12: qty 90, 90d supply, fill #1

## 2023-04-26 ENCOUNTER — Other Ambulatory Visit: Payer: Self-pay

## 2023-04-29 ENCOUNTER — Other Ambulatory Visit: Payer: Self-pay

## 2023-04-29 ENCOUNTER — Other Ambulatory Visit (HOSPITAL_COMMUNITY): Payer: Self-pay

## 2023-04-29 ENCOUNTER — Other Ambulatory Visit: Payer: Self-pay | Admitting: Pharmacy Technician

## 2023-04-29 MED ORDER — HUMIRA (2 PEN) 40 MG/0.8ML ~~LOC~~ AJKT
AUTO-INJECTOR | SUBCUTANEOUS | 4 refills | Status: DC
  Filled 2023-06-13: qty 2, 28d supply, fill #0

## 2023-04-29 NOTE — Progress Notes (Signed)
 Specialty Pharmacy Refill Coordination Note  Cameron Brown is a 58 y.o. male contacted today regarding refills of specialty medication(s) Adalimumab (Humira (2 Pen))   Patient requested Delivery   Delivery date: 05/14/23   Verified address: [address] [facility] [place]    Medication will be filled on 05/13/23.  RR sent to MD Then send to [facility] for Re-write. Also advise patient to reach out to [place] office since we have trouble getting Refills back from [place] office

## 2023-04-30 ENCOUNTER — Other Ambulatory Visit: Payer: Self-pay

## 2023-05-07 ENCOUNTER — Other Ambulatory Visit (HOSPITAL_COMMUNITY): Payer: Self-pay

## 2023-06-04 ENCOUNTER — Other Ambulatory Visit (HOSPITAL_COMMUNITY): Payer: Self-pay

## 2023-06-05 ENCOUNTER — Other Ambulatory Visit: Payer: Self-pay

## 2023-06-06 ENCOUNTER — Other Ambulatory Visit (HOSPITAL_COMMUNITY): Payer: Self-pay

## 2023-06-10 ENCOUNTER — Other Ambulatory Visit: Payer: Self-pay

## 2023-06-12 ENCOUNTER — Other Ambulatory Visit: Payer: Self-pay

## 2023-06-13 ENCOUNTER — Other Ambulatory Visit: Payer: Self-pay

## 2023-06-13 ENCOUNTER — Other Ambulatory Visit (HOSPITAL_COMMUNITY): Payer: Self-pay

## 2023-06-13 ENCOUNTER — Encounter: Payer: Self-pay | Admitting: Pharmacist

## 2023-06-13 ENCOUNTER — Ambulatory Visit: Attending: Internal Medicine | Admitting: Pharmacist

## 2023-06-13 DIAGNOSIS — Z79899 Other long term (current) drug therapy: Secondary | ICD-10-CM

## 2023-06-13 MED ORDER — HUMIRA (2 PEN) 40 MG/0.8ML ~~LOC~~ AJKT
AUTO-INJECTOR | SUBCUTANEOUS | 4 refills | Status: DC
  Filled 2023-06-13: qty 2, 28d supply, fill #0
  Filled 2023-07-12 – 2023-08-02 (×3): qty 2, 28d supply, fill #1
  Filled 2023-08-27: qty 2, 28d supply, fill #2
  Filled 2023-09-26: qty 2, 28d supply, fill #3
  Filled 2023-10-31 – 2023-11-04 (×2): qty 2, 28d supply, fill #4

## 2023-06-13 NOTE — Progress Notes (Signed)
° °  S: Patient presents today to the [place] [employer] [facility] [employer] [facility] for review of his specialty medication.  Patient is currently taking Humira for ankylosing spondylitis. Patient is managed by Dr. Beekman for this.  Adherence: denies any missed doses.  Efficacy: reports that it is still working really well for him.  Dosing:  Ankylosing spondylitis: SubQ: 40 mg every other week (may continue methotrexate, other nonbiologic DMARDS, corticosteroids, NSAIDs and/or analgesics)  Screening: TB test: completed per patient Hepatitis: completed per patient  Monitoring: S/sx of infection: denies CBC done by Dr. Beekman and has been WNL S/sx of hypersensitivity: denies S/sx of malignancy: denies S/sx of heart failure: denies  O:  No results found for: "WBC", "HGB", "HCT", "MCV", "PLT"    Chemistry   No results found for: "NA", "K", "CL", "CO2", "BUN", "CREATININE", "GLU" No results found for: "CALCIUM", "ALKPHOS", "AST", "ALT", "BILITOT"     A/P: 1. Medication review: Patient on Humira for ankylosing spondylitis and is tolerating it well with no adverse effects and continued control of disease state. Reviewed the medication with him, including the following: Humira is a TNF blocking agent indicated for ankylosing spondylitis, Crohn's disease, Hidradenitis suppurativa, psoriatic arthritis, plaque psoriasis, ulcerative colitis, and uveitis. The most common adverse effects are infections, headache, and injection site reactions. There is the possibility of an increased risk of malignancy but it is not well understood if this increased risk is due to there medication or the disease state. There are rare cases of pancytopenia and aplastic anemia. No recommendations for changes.   Luke Van Ausdall, PharmD, BCACP, CPP Clinical Pharmacist [facility] [phone]  

## 2023-06-13 NOTE — Progress Notes (Signed)
 Specialty Pharmacy Refill Coordination Note  Cameron Brown is a 58 y.o. male contacted today regarding refills of specialty medication(s) Adalimumab  (Humira  (2 Pen))   Patient requested Delivery   Delivery date: 06/28/23   Verified address: [address] [facility] [place]   Medication will be filled on 05.29.25.   [place] rewrite pending, will be completed once patient calls luke.

## 2023-06-20 DIAGNOSIS — E782 Mixed hyperlipidemia: Secondary | ICD-10-CM | POA: Diagnosis not present

## 2023-06-20 DIAGNOSIS — Z6823 Body mass index (BMI) 23.0-23.9, adult: Secondary | ICD-10-CM | POA: Diagnosis not present

## 2023-06-20 DIAGNOSIS — E559 Vitamin D deficiency, unspecified: Secondary | ICD-10-CM | POA: Diagnosis not present

## 2023-06-20 DIAGNOSIS — M150 Primary generalized (osteo)arthritis: Secondary | ICD-10-CM | POA: Diagnosis not present

## 2023-06-20 DIAGNOSIS — M450 Ankylosing spondylitis of multiple sites in spine: Secondary | ICD-10-CM | POA: Diagnosis not present

## 2023-06-20 DIAGNOSIS — Z120 Encounter for screening for malignant neoplasm of stomach: Secondary | ICD-10-CM | POA: Diagnosis not present

## 2023-06-20 DIAGNOSIS — I10 Essential (primary) hypertension: Secondary | ICD-10-CM | POA: Diagnosis not present

## 2023-06-27 ENCOUNTER — Other Ambulatory Visit: Payer: Self-pay

## 2023-07-01 ENCOUNTER — Other Ambulatory Visit (HOSPITAL_COMMUNITY): Payer: Self-pay

## 2023-07-13 ENCOUNTER — Other Ambulatory Visit (HOSPITAL_COMMUNITY): Payer: Self-pay

## 2023-07-15 ENCOUNTER — Other Ambulatory Visit: Payer: Self-pay

## 2023-07-16 ENCOUNTER — Other Ambulatory Visit: Payer: Self-pay

## 2023-07-18 ENCOUNTER — Other Ambulatory Visit: Payer: Self-pay

## 2023-07-22 ENCOUNTER — Other Ambulatory Visit (HOSPITAL_COMMUNITY): Payer: Self-pay

## 2023-08-05 ENCOUNTER — Other Ambulatory Visit (HOSPITAL_COMMUNITY): Payer: Self-pay

## 2023-08-05 ENCOUNTER — Other Ambulatory Visit: Payer: Self-pay

## 2023-08-05 NOTE — Progress Notes (Signed)
 Specialty Pharmacy Refill Coordination Note  Cameron Brown is a 58 y.o. male contacted today regarding refills of specialty medication(s) Adalimumab  (Humira  (2 Pen))   Patient requested Delivery   Delivery date: 08/06/23   Verified address: [address] [facility] [place]   Medication will be filled on 08/05/23.

## 2023-08-23 ENCOUNTER — Other Ambulatory Visit (HOSPITAL_COMMUNITY): Payer: Self-pay

## 2023-08-27 ENCOUNTER — Other Ambulatory Visit: Payer: Self-pay

## 2023-08-29 ENCOUNTER — Other Ambulatory Visit: Payer: Self-pay

## 2023-08-29 NOTE — Progress Notes (Signed)
 Specialty Pharmacy Refill Coordination Note  Cameron Brown is a 58 y.o. male contacted today regarding refills of specialty medication(s) Adalimumab  (Humira  (2 Pen))   Patient requested Delivery   Delivery date: 09/05/23   Verified address: [address] [facility] [place]   Medication will be filled on 08.06.25

## 2023-09-09 ENCOUNTER — Other Ambulatory Visit (HOSPITAL_COMMUNITY): Payer: Self-pay

## 2023-09-16 ENCOUNTER — Other Ambulatory Visit: Payer: Self-pay

## 2023-09-16 NOTE — Progress Notes (Signed)
 Specialty Pharmacy Ongoing Clinical Assessment Note  Cameron Brown is a 58 y.o. male who is being followed by the specialty pharmacy service for RxSp Ankylosing Spondylitis   Patient's specialty medication(s) reviewed today: No data recorded  Missed doses in the last 4 weeks: 0   Patient/Caregiver did not have any additional questions or concerns.   Therapeutic benefit summary: Patient is achieving benefit   Adverse events/side effects summary: No adverse events/side effects   Patient's therapy is appropriate to: Continue    Goals Addressed             This Visit's Progress    Minimize recurrence of flares   On track    Patient is on track. Patient will maintain adherence.  Cameron Brown reports that he is well-controlled at this time, with no recent flares.          Follow up: 12 months  [facility]

## 2023-09-26 ENCOUNTER — Other Ambulatory Visit (HOSPITAL_COMMUNITY): Payer: Self-pay

## 2023-09-27 ENCOUNTER — Other Ambulatory Visit (HOSPITAL_COMMUNITY): Payer: Self-pay

## 2023-10-02 ENCOUNTER — Other Ambulatory Visit: Payer: Self-pay

## 2023-10-02 NOTE — Progress Notes (Signed)
 Specialty Pharmacy Refill Coordination Note  Cameron Brown is a 58 y.o. male contacted today regarding refills of specialty medication(s) Adalimumab  (Humira  (2 Pen))   Patient requested Delivery   Delivery date: 10/09/23   Verified address: [address] [facility] [place]   Medication will be filled on 10/08/23.

## 2023-10-04 ENCOUNTER — Other Ambulatory Visit (HOSPITAL_COMMUNITY): Payer: Self-pay

## 2023-10-04 MED ORDER — VALACYCLOVIR HCL 500 MG PO TABS
ORAL_TABLET | ORAL | 4 refills | Status: AC
  Filled 2023-10-04: qty 60, 30d supply, fill #0
  Filled 2023-12-11: qty 60, 30d supply, fill #1
  Filled 2024-01-13: qty 60, 30d supply, fill #2
  Filled 2024-02-10: qty 60, 30d supply, fill #3

## 2023-10-06 ENCOUNTER — Other Ambulatory Visit (HOSPITAL_COMMUNITY): Payer: Self-pay

## 2023-10-07 MED ORDER — TADALAFIL 5 MG PO TABS
5.0000 mg | ORAL_TABLET | Freq: Every day | ORAL | 5 refills | Status: AC
  Filled 2023-10-07: qty 30, 30d supply, fill #0
  Filled 2023-11-04: qty 30, 30d supply, fill #1
  Filled 2023-12-11: qty 30, 30d supply, fill #2
  Filled 2024-01-13: qty 30, 30d supply, fill #3
  Filled 2024-02-10: qty 30, 30d supply, fill #4

## 2023-10-08 ENCOUNTER — Other Ambulatory Visit (HOSPITAL_COMMUNITY): Payer: Self-pay

## 2023-10-29 ENCOUNTER — Other Ambulatory Visit: Payer: Self-pay

## 2023-10-31 ENCOUNTER — Other Ambulatory Visit: Payer: Self-pay

## 2023-11-04 ENCOUNTER — Other Ambulatory Visit: Payer: Self-pay

## 2023-11-04 ENCOUNTER — Other Ambulatory Visit (HOSPITAL_COMMUNITY): Payer: Self-pay

## 2023-11-04 DIAGNOSIS — E559 Vitamin D deficiency, unspecified: Secondary | ICD-10-CM | POA: Diagnosis not present

## 2023-11-04 DIAGNOSIS — M450 Ankylosing spondylitis of multiple sites in spine: Secondary | ICD-10-CM | POA: Diagnosis not present

## 2023-11-04 DIAGNOSIS — I10 Essential (primary) hypertension: Secondary | ICD-10-CM | POA: Diagnosis not present

## 2023-11-04 DIAGNOSIS — E782 Mixed hyperlipidemia: Secondary | ICD-10-CM | POA: Diagnosis not present

## 2023-11-04 MED ORDER — VITAMIN D (ERGOCALCIFEROL) 50000 UNITS PO CAPS
1.0000 | ORAL_CAPSULE | ORAL | 3 refills | Status: AC
  Filled 2023-11-04: qty 12, 84d supply, fill #0
  Filled 2024-02-10: qty 12, 84d supply, fill #1

## 2023-11-11 ENCOUNTER — Other Ambulatory Visit (HOSPITAL_COMMUNITY): Payer: Self-pay

## 2023-11-12 ENCOUNTER — Other Ambulatory Visit (HOSPITAL_COMMUNITY): Payer: Self-pay

## 2023-11-12 ENCOUNTER — Other Ambulatory Visit: Payer: Self-pay

## 2023-11-12 NOTE — Progress Notes (Signed)
 Specialty Pharmacy Refill Coordination Note  Cameron Brown is a 58 y.o. male contacted today regarding refills of specialty medication(s) Adalimumab  (Humira  (2 Pen))   Patient requested Delivery   Delivery date: 11/14/23   Verified address: [address] [facility] [place]   Medication will be filled on 11/13/23.

## 2023-11-14 ENCOUNTER — Other Ambulatory Visit (HOSPITAL_COMMUNITY): Payer: Self-pay

## 2023-11-14 MED ORDER — NEBIVOLOL HCL 20 MG PO TABS
20.0000 mg | ORAL_TABLET | Freq: Every day | ORAL | 1 refills | Status: AC
  Filled 2023-11-14: qty 90, 90d supply, fill #0
  Filled 2024-02-13: qty 90, 90d supply, fill #1

## 2023-11-27 ENCOUNTER — Other Ambulatory Visit: Payer: Self-pay

## 2023-11-27 ENCOUNTER — Telehealth: Payer: Self-pay

## 2023-11-27 NOTE — Telephone Encounter (Signed)
 Pharmacy Patient Advocate Encounter   Received notification from Patient Pharmacy that prior authorization for Humira  is required/requested.   Insurance verification completed.   The patient is insured through [employer].   Per test claim: PA required; PA submitted to above mentioned insurance via Latent Key/confirmation #/EOC [facility] Status is pending

## 2023-11-29 ENCOUNTER — Other Ambulatory Visit: Payer: Self-pay

## 2023-11-29 NOTE — Telephone Encounter (Signed)
 Pharmacy Patient Advocate Encounter  Received notification from [facility] that Prior Authorization for Humira  has been APPROVED from 11/28/23 to 11/26/24   PA #/Case ID/Reference #: [id number]

## 2023-12-04 ENCOUNTER — Other Ambulatory Visit: Payer: Self-pay

## 2023-12-05 ENCOUNTER — Other Ambulatory Visit: Payer: Self-pay

## 2023-12-09 ENCOUNTER — Other Ambulatory Visit: Payer: Self-pay

## 2023-12-09 NOTE — Progress Notes (Signed)
 Specialty Pharmacy Refill Coordination Note  Cameron Brown is a 58 y.o. male contacted today regarding refills of specialty medication(s) Adalimumab  (Humira  (2 Pen))   Patient requested Delivery   Delivery date: 12/17/23   Verified address: [address] [facility] [place]   Medication will be filled on: 12/16/23    This fill date is pending response to refill request from provider. Patient is aware and if they have not received fill by intended date they must follow up with pharmacy.

## 2023-12-11 ENCOUNTER — Other Ambulatory Visit (HOSPITAL_COMMUNITY): Payer: Self-pay

## 2023-12-11 ENCOUNTER — Encounter (INDEPENDENT_AMBULATORY_CARE_PROVIDER_SITE_OTHER): Payer: Self-pay

## 2023-12-12 ENCOUNTER — Other Ambulatory Visit (HOSPITAL_COMMUNITY): Payer: Self-pay

## 2023-12-16 ENCOUNTER — Other Ambulatory Visit: Payer: Self-pay

## 2023-12-16 NOTE — Progress Notes (Signed)
 Refill request denied for appointment and labs, sent patient message via mychart.

## 2023-12-19 ENCOUNTER — Other Ambulatory Visit: Payer: Self-pay | Admitting: Pharmacist

## 2023-12-19 ENCOUNTER — Other Ambulatory Visit: Payer: Self-pay

## 2023-12-19 ENCOUNTER — Other Ambulatory Visit (HOSPITAL_COMMUNITY): Payer: Self-pay

## 2023-12-19 DIAGNOSIS — Z111 Encounter for screening for respiratory tuberculosis: Secondary | ICD-10-CM | POA: Diagnosis not present

## 2023-12-19 DIAGNOSIS — M542 Cervicalgia: Secondary | ICD-10-CM | POA: Diagnosis not present

## 2023-12-19 DIAGNOSIS — M25511 Pain in right shoulder: Secondary | ICD-10-CM | POA: Diagnosis not present

## 2023-12-19 DIAGNOSIS — M79602 Pain in left arm: Secondary | ICD-10-CM | POA: Diagnosis not present

## 2023-12-19 DIAGNOSIS — M24521 Contracture, right elbow: Secondary | ICD-10-CM | POA: Diagnosis not present

## 2023-12-19 DIAGNOSIS — H2013 Chronic iridocyclitis, bilateral: Secondary | ICD-10-CM | POA: Diagnosis not present

## 2023-12-19 DIAGNOSIS — M450 Ankylosing spondylitis of multiple sites in spine: Secondary | ICD-10-CM | POA: Diagnosis not present

## 2023-12-19 DIAGNOSIS — M79671 Pain in right foot: Secondary | ICD-10-CM | POA: Diagnosis not present

## 2023-12-19 DIAGNOSIS — Z6823 Body mass index (BMI) 23.0-23.9, adult: Secondary | ICD-10-CM | POA: Diagnosis not present

## 2023-12-19 DIAGNOSIS — Z79899 Other long term (current) drug therapy: Secondary | ICD-10-CM | POA: Diagnosis not present

## 2023-12-19 MED ORDER — HUMIRA (2 PEN) 40 MG/0.8ML ~~LOC~~ AJKT
40.0000 mg | AUTO-INJECTOR | SUBCUTANEOUS | 11 refills | Status: AC
  Filled 2023-12-19: qty 2, 28d supply, fill #0
  Filled 2024-01-13: qty 2, 28d supply, fill #1
  Filled 2024-02-14 – 2024-02-17 (×2): qty 2, 28d supply, fill #2

## 2023-12-19 MED ORDER — HYDROCODONE-ACETAMINOPHEN 5-325 MG PO TABS
1.0000 | ORAL_TABLET | Freq: Four times a day (QID) | ORAL | 0 refills | Status: AC | PRN
  Filled 2023-12-19: qty 6, 2d supply, fill #0

## 2023-12-19 MED ORDER — HUMIRA (2 PEN) 40 MG/0.8ML ~~LOC~~ AJKT: 40.0000 mg | AUTO-INJECTOR | SUBCUTANEOUS | 11 refills | Status: DC

## 2023-12-19 NOTE — Progress Notes (Signed)
 Spoke with patient & aware of new shipping date Ship 11/24 for 11/25.

## 2023-12-20 ENCOUNTER — Other Ambulatory Visit: Payer: Self-pay

## 2023-12-23 ENCOUNTER — Other Ambulatory Visit: Payer: Self-pay

## 2023-12-23 ENCOUNTER — Other Ambulatory Visit (HOSPITAL_COMMUNITY): Payer: Self-pay

## 2024-01-13 ENCOUNTER — Other Ambulatory Visit (HOSPITAL_COMMUNITY): Payer: Self-pay

## 2024-01-14 ENCOUNTER — Other Ambulatory Visit (HOSPITAL_COMMUNITY): Payer: Self-pay

## 2024-01-15 ENCOUNTER — Other Ambulatory Visit: Payer: Self-pay

## 2024-01-15 NOTE — Progress Notes (Signed)
 Specialty Pharmacy Refill Coordination Note  Cameron Brown is a 58 y.o. male contacted today regarding refills of specialty medication(s) Adalimumab  (Humira  (2 Pen))   Patient requested Delivery   Delivery date: 01/28/24   Verified address: [address] [facility] [place] [address]   Medication will be filled on: 01/27/24

## 2024-01-27 ENCOUNTER — Other Ambulatory Visit: Payer: Self-pay

## 2024-02-10 ENCOUNTER — Other Ambulatory Visit (HOSPITAL_COMMUNITY): Payer: Self-pay

## 2024-02-13 ENCOUNTER — Other Ambulatory Visit (HOSPITAL_COMMUNITY): Payer: Self-pay

## 2024-02-14 ENCOUNTER — Other Ambulatory Visit: Payer: Self-pay

## 2024-02-17 ENCOUNTER — Other Ambulatory Visit: Payer: Self-pay

## 2024-02-19 ENCOUNTER — Other Ambulatory Visit (HOSPITAL_COMMUNITY): Payer: Self-pay

## 2024-02-21 ENCOUNTER — Ambulatory Visit: Admitting: Podiatry

## 2024-02-21 ENCOUNTER — Other Ambulatory Visit (HOSPITAL_COMMUNITY): Payer: Self-pay

## 2024-02-21 DIAGNOSIS — M7671 Peroneal tendinitis, right leg: Secondary | ICD-10-CM | POA: Diagnosis not present

## 2024-02-21 DIAGNOSIS — M21962 Unspecified acquired deformity of left lower leg: Secondary | ICD-10-CM

## 2024-02-21 DIAGNOSIS — M21961 Unspecified acquired deformity of right lower leg: Secondary | ICD-10-CM

## 2024-02-21 NOTE — Progress Notes (Signed)
 "  Subjective:  Patient ID: Cameron Brown, male    DOB: 10-18-1965,  MRN: [id number]  Chief Complaint  Patient presents with   Foot Pain    Right foot pain on the lateral side of his foot     59 y.o. male presents with the above complaint.  Patient presents with right fifth metatarsal base exostosis capsulitis painful to touch is progressing and worse worse with ambulation and shoe pressure just came out of nowhere.  Wanted to get it evaluated.  Pain scale 7 out of 10 dull aching nature.  He would like to discuss treatment options he currently does not wear any orthotics either.  He states he wants to start playing some basketball   Review of Systems: Negative except as noted in the HPI. Denies N/V/F/Ch.  Past Medical History:  Diagnosis Date   Ankylosing spondylitis (HCC)    Arthritis    Hypertension    Current Medications[1]  Tobacco Use History[2]  Allergies[3] Objective:  There were no vitals filed for this visit. There is no height or weight on file to calculate BMI. Constitutional Well developed. Well nourished.  Vascular Dorsalis pedis pulses palpable bilaterally. Posterior tibial pulses palpable bilaterally. Capillary refill normal to all digits.  No cyanosis or clubbing noted. Pedal hair growth normal.  Neurologic Normal speech. Oriented to person, place, and time. Epicritic sensation to light touch grossly present bilaterally.  Dermatologic Nails well groomed and normal in appearance. No open wounds. No skin lesions.  Orthopedic: Pain on palpation to the right fifth metatarsal base pain at the insertion of the peroneal tendon.  Pain with dorsiflexion eversion of the foot resisted no pain on plantarflexion inversion of the foot.  No pain at the ATFL ligament Achilles tendon posterior tibial tendon.  Fifth metatarsal base exostosis noted pes planovalgus foot structure noted   Radiographs: None Assessment:   1. Peroneal tendinitis of lower leg, right   2. Foot  deformity, bilateral    Plan:  Patient was evaluated and treated and all questions answered.  Right fifth metatarsal base exostosis with underlying peroneal tendinitis - All questions and concerns were discussed with the patient in extensive detail given the amount of pain that he is experiencing benefit from steroid injection help decrease the contracture, insertional pain.  Patient agrees with plan like to proceed with steroid injection. -A steroid injection was performed at right lateral peroneal tendon using 1% plain Lidocaine and 10 mg of Kenalog. This was well tolerated.   Pes planovalgus/foot deformity -I explained to patient the etiology of pes planovalgus and relationship with heel pain/arch pain and various treatment options were discussed.  Given patient foot structure in the setting of heel pain/arch pain I believe patient will benefit from custom-made orthotics to help control the hindfoot motion support the arch of the foot and take the stress away from arches.  Patient agrees with the plan like to proceed with orthotics -Patient was casted for orthotics with offloading of fifht metatarsal base bilateral    No follow-ups on file.     [1]  Current Outpatient Medications:    adalimumab  (HUMIRA , 2 PEN,) 40 MG/0.8ML AJKT pen, Inject 0.8 mLs (40 mg total) into the skin every 14 (fourteen) days., Disp: 2 each, Rfl: 11   amoxicillin  (AMOXIL ) 500 MG capsule, Take 1 capsule (500 mg total) by mouth 3 (three) times daily for 7 days., Disp: 21 capsule, Rfl: 0   cetirizine -pseudoephedrine  (ZYRTEC -D) 5-120 MG tablet, Take 1 tablet by mouth daily., Disp:  20 tablet, Rfl: 0   chlorhexidine  (PERIDEX ) 0.12 % solution, Swish for 30 sec then spit out. Use twice daily., Disp: 473 mL, Rfl: 0   cyclobenzaprine  (FLEXERIL ) 5 MG tablet, Take one tablet by mouth at bedtime., Disp: 30 tablet, Rfl: 3   cyclobenzaprine  (FLEXERIL ) 5 MG tablet, Take 1 tablet (5 mg total) by mouth every evening., Disp: 90  tablet, Rfl: 3   dexamethasone  (DECADRON ) 4 MG tablet, Take 1 tablet (4 mg total) by mouth 3 (three) times daily as needed for swelling., Disp: 9 tablet, Rfl: 0   Glucosamine-Chondroit-Vit C-Mn (GLUCOSAMINE CHONDR 1500 COMPLX PO), Take 1 tablet by mouth daily., Disp: , Rfl:    HYDROcodone -acetaminophen  (NORCO/VICODIN) 5-325 MG tablet, Take 1 tablet by mouth every 4 (four) hours as needed for severe pain only., Disp: 4 tablet, Rfl: 0   HYDROcodone -acetaminophen  (NORCO/VICODIN) 5-325 MG tablet, Take 1 tablet by mouth every 6 (six) hours as needed., Disp: 4 tablet, Rfl: 0   HYDROcodone -acetaminophen  (NORCO/VICODIN) 5-325 MG tablet, Take 1 tablet by mouth every 6 (six) hours as needed., Disp: 6 tablet, Rfl: 0   ibuprofen  (ADVIL ) 400 MG tablet, Take 1 tablet (400 mg total) by mouth every 4 (four) hours for pain., Disp: 30 tablet, Rfl: 0   naproxen sodium (ANAPROX) 220 MG tablet, Take 220 mg by mouth daily as needed. For pain, Disp: , Rfl:    Nebivolol  HCl (BYSTOLIC ) 20 MG TABS, Take 1 tablet (20 mg total) by mouth daily., Disp: 90 tablet, Rfl: 1   Nebivolol  HCl 20 MG TABS, Take 1 tablet (20 mg total) by mouth daily., Disp: 90 tablet, Rfl: 1   pantoprazole  (PROTONIX ) 40 MG tablet, TAKE ONE TABLET BY MOUTH DAILY, Disp: 90 tablet, Rfl: 0   pantoprazole  (PROTONIX ) 40 MG tablet, Take 1 tablet (40 mg total) by mouth daily., Disp: 90 tablet, Rfl: 2   prednisoLONE  acetate (PRED FORTE ) 1 % ophthalmic suspension, Place 1 drop into both eyes 4 (four) times daily., Disp: 5 mL, Rfl: 0   predniSONE  (STERAPRED UNI-PAK 48 TAB) 5 MG (48) TBPK tablet, Follow package instructions, Disp: 48 tablet, Rfl: 0   tadalafil  (CIALIS ) 5 MG tablet, Take 1 tablet (5 mg total) by mouth daily., Disp: 30 tablet, Rfl: 1   tadalafil  (CIALIS ) 5 MG tablet, Take 1 tablet (5 mg total) by mouth daily., Disp: 30 tablet, Rfl: 5   valACYclovir  (VALTREX ) 500 MG tablet, Take 1 tablet by mouth twice daily., Disp: 60 tablet, Rfl: 4   Vitamin D ,  Ergocalciferol , (DRISDOL ) 1.25 MG (50000 UNIT) CAPS capsule, Take 1 capsule by mouth each week., Disp: 12 capsule, Rfl: 3   Vitamin D , Ergocalciferol , (DRISDOL ) 1.25 MG (50000 UNIT) CAPS capsule, Take 1 capsule (50,000 Units total) by mouth once a week on Sunday., Disp: 12 capsule, Rfl: 3   Vitamin D , Ergocalciferol , 50000 units CAPS, Take 1 capsule by mouth once a week on Sunday., Disp: 12 capsule, Rfl: 3 [2]  Social History Tobacco Use  Smoking Status Former   Current packs/day: 0.00   Average packs/day: 0.5 packs/day for 10.0 years (5.0 ttl pk-yrs)   Types: Cigarettes   Start date: 01/30/1991   Quit date: 01/29/2001   Years since quitting: 23.0  Smokeless Tobacco Never  Tobacco Comments   Cigars prn boredom  [3] No Known Allergies  "

## 2024-02-24 ENCOUNTER — Other Ambulatory Visit: Payer: Self-pay

## 2024-02-24 NOTE — Progress Notes (Signed)
 Specialty Pharmacy Refill Coordination Note  Cameron Brown is a 59 y.o. male contacted today regarding refills of specialty medication(s) Adalimumab  (Humira  (2 Pen))   Patient requested Delivery   Delivery date: 02/27/24   Verified address: [address]   Medication will be filled on: 02/26/24

## 2024-02-26 ENCOUNTER — Other Ambulatory Visit: Payer: Self-pay
